# Patient Record
Sex: Female | Born: 1955 | Race: White | Hispanic: No | Smoking: Former smoker
Health system: Southern US, Community
[De-identification: ages and names within clinical notes are randomized; demographics above are authoritative.]

## PROBLEM LIST (undated history)

## (undated) DIAGNOSIS — G5602 Carpal tunnel syndrome, left upper limb: Secondary | ICD-10-CM

## (undated) DIAGNOSIS — I10 Essential (primary) hypertension: Secondary | ICD-10-CM

## (undated) DIAGNOSIS — T7840XA Allergy, unspecified, initial encounter: Secondary | ICD-10-CM

## (undated) DIAGNOSIS — F1010 Alcohol abuse, uncomplicated: Secondary | ICD-10-CM

## (undated) DIAGNOSIS — I517 Cardiomegaly: Secondary | ICD-10-CM

## (undated) HISTORY — DX: Carpal tunnel syndrome, left upper limb: G56.02

## (undated) HISTORY — PX: CARPAL TUNNEL RELEASE: SHX101

## (undated) HISTORY — DX: Alcohol abuse, uncomplicated: F10.10

## (undated) HISTORY — PX: EYE SURGERY: SHX253

## (undated) HISTORY — DX: Cardiomegaly: I51.7

## (undated) HISTORY — DX: Allergy, unspecified, initial encounter: T78.40XA

---

## 1999-01-13 ENCOUNTER — Other Ambulatory Visit: Admission: RE | Admit: 1999-01-13 | Discharge: 1999-01-13 | Payer: Self-pay | Admitting: Obstetrics & Gynecology

## 1999-07-16 ENCOUNTER — Ambulatory Visit (HOSPITAL_BASED_OUTPATIENT_CLINIC_OR_DEPARTMENT_OTHER): Admission: RE | Admit: 1999-07-16 | Discharge: 1999-07-16 | Payer: Self-pay | Admitting: Orthopedic Surgery

## 2000-03-02 ENCOUNTER — Other Ambulatory Visit: Admission: RE | Admit: 2000-03-02 | Discharge: 2000-03-02 | Payer: Self-pay | Admitting: Obstetrics & Gynecology

## 2001-03-10 ENCOUNTER — Other Ambulatory Visit: Admission: RE | Admit: 2001-03-10 | Discharge: 2001-03-10 | Payer: Self-pay | Admitting: Obstetrics & Gynecology

## 2002-03-22 ENCOUNTER — Other Ambulatory Visit: Admission: RE | Admit: 2002-03-22 | Discharge: 2002-03-22 | Payer: Self-pay | Admitting: Obstetrics and Gynecology

## 2002-05-18 ENCOUNTER — Ambulatory Visit (HOSPITAL_COMMUNITY): Admission: RE | Admit: 2002-05-18 | Discharge: 2002-05-18 | Payer: Self-pay | Admitting: Internal Medicine

## 2002-05-18 ENCOUNTER — Encounter: Payer: Self-pay | Admitting: Internal Medicine

## 2003-04-04 ENCOUNTER — Other Ambulatory Visit: Admission: RE | Admit: 2003-04-04 | Discharge: 2003-04-04 | Payer: Self-pay | Admitting: Obstetrics & Gynecology

## 2004-05-30 ENCOUNTER — Other Ambulatory Visit: Admission: RE | Admit: 2004-05-30 | Discharge: 2004-05-30 | Payer: Self-pay | Admitting: Obstetrics & Gynecology

## 2005-06-01 ENCOUNTER — Other Ambulatory Visit: Admission: RE | Admit: 2005-06-01 | Discharge: 2005-06-01 | Payer: Self-pay | Admitting: Obstetrics and Gynecology

## 2012-07-06 ENCOUNTER — Ambulatory Visit (INDEPENDENT_AMBULATORY_CARE_PROVIDER_SITE_OTHER): Payer: 59 | Admitting: Physician Assistant

## 2012-07-06 VITALS — BP 139/69 | HR 80 | Temp 98.1°F | Resp 16 | Ht 61.0 in | Wt 132.0 lb

## 2012-07-06 DIAGNOSIS — H6120 Impacted cerumen, unspecified ear: Secondary | ICD-10-CM

## 2012-07-06 DIAGNOSIS — J069 Acute upper respiratory infection, unspecified: Secondary | ICD-10-CM

## 2012-07-06 MED ORDER — IPRATROPIUM BROMIDE 0.03 % NA SOLN: 2.0000 | Freq: Two times a day (BID) | NASAL | Status: DC

## 2012-07-06 MED ORDER — BENZONATATE 100 MG PO CAPS: 100.0000 mg | ORAL_CAPSULE | Freq: Three times a day (TID) | ORAL | Status: DC | PRN

## 2012-07-06 MED ORDER — GUAIFENESIN ER 1200 MG PO TB12: 1.0000 | ORAL_TABLET | Freq: Two times a day (BID) | ORAL | Status: DC | PRN

## 2012-07-06 NOTE — Patient Instructions (Signed)
Get plenty of rest and drink at least 64 ounces of water daily. If your symptoms are not beginning to improve in the next 3-4 days, please contact me with your specific symptoms, as you may need an antibiotic.

## 2012-07-06 NOTE — Progress Notes (Signed)
  Subjective:    Patient ID: Rebekah Fischer, female    DOB: Jun 19, 1955, 57 y.o.   MRN: [id number]  HPI This 57 y.o. female presents for evaluation of possible bronchitis.  2-3 days ago developed a head cold, and it has now moved into the chest.  "I don't want to wait like I usually do, until it turns into bronchitis."  Laryngitis upon waking up this morning, resolved.  Mild sore throat.  Pain in chest and throat with coughing.  Sputum is yellowish white.  "My lips feel hot and that usually means I have a fever."  No GI/GU/unexplained myalgias or arthralgias.   Past Medical History  Diagnosis Date  . Allergy     Past Surgical History  Procedure Laterality Date  . Carpal tunnel release      Prior to Admission medications   Not on File    Allergies  Allergen Reactions  . Penicillins Rash    History   Social History  . Marital Status: Divorced    Spouse Name: N/A    Number of Children: 0  . Years of Education: 16   Occupational History  . Licensed conveyancer    Social History Main Topics  . Smoking status: Former Smoker -- 0.50 packs/day    Types: Cigarettes    Start date: 08/11/1969    Quit date: 08/11/1984  . Smokeless tobacco: Never Used  . Alcohol Use: 1.5 oz/week    3 drink(s) per week  . Drug Use: No     Comment: previously used marijuana  . Sexually Active: No   Other Topics Concern  . Not on file   Social History Narrative   Lives alone.    Family History  Problem Relation Age of Onset  . Heart failure Mother   . Hypertension Mother   . Emphysema Father   . Hypertension Brother   . Cancer Sister     "blood cancer"    Review of Systems As above.    Objective:   Physical Exam  Blood pressure 139/69, pulse 80, temperature 98.1 F (36.7 C), resp. rate 16, height 5\' 1"  (1.549 m), weight 132 lb (59.875 kg). Body mass index is 24.95 kg/(m^2). Well-developed, well nourished WF who is awake, alert and oriented, in NAD. HEENT: [place]/AT, PERRL, EOMI.  Sclera  and conjunctiva are clear.  EAC are patent, after removal of cerumen from the LEFT canal, TMs are normal in appearance. Nasal mucosa is mildly congested, pink and moist. OP is clear. Neck: supple, non-tender, no lymphadenopathy, thyromegaly. Heart: RRR, no murmur Lungs: normal effort, CTA Extremities: no cyanosis, clubbing or edema. Skin: warm and dry without rash. Psychologic: good mood and appropriate affect, normal speech and behavior.       Assessment & Plan:  Viral URI with cough - Plan: ipratropium (ATROVENT) 0.03 % nasal spray, Guaifenesin (MUCINEX MAXIMUM STRENGTH) 1200 MG TB12, benzonatate (TESSALON) 100 MG capsule  Excess wax in ear, left -removed  Patient Instructions  Get plenty of rest and drink at least 64 ounces of water daily. If your symptoms are not beginning to improve in the next 3-4 days, please contact me with your specific symptoms, as you may need an antibiotic.

## 2012-08-26 ENCOUNTER — Encounter: Payer: Self-pay | Admitting: Internal Medicine

## 2013-03-09 ENCOUNTER — Other Ambulatory Visit: Payer: Self-pay

## 2013-08-16 ENCOUNTER — Encounter: Payer: Self-pay | Admitting: Internal Medicine

## 2013-12-26 ENCOUNTER — Telehealth: Payer: Self-pay

## 2013-12-26 ENCOUNTER — Ambulatory Visit (INDEPENDENT_AMBULATORY_CARE_PROVIDER_SITE_OTHER): Payer: 59 | Admitting: Internal Medicine

## 2013-12-26 VITALS — BP 138/80 | HR 76 | Temp 97.8°F | Resp 16 | Ht 61.0 in | Wt 123.0 lb

## 2013-12-26 DIAGNOSIS — R21 Rash and other nonspecific skin eruption: Secondary | ICD-10-CM

## 2013-12-26 DIAGNOSIS — L282 Other prurigo: Secondary | ICD-10-CM

## 2013-12-26 DIAGNOSIS — L259 Unspecified contact dermatitis, unspecified cause: Secondary | ICD-10-CM

## 2013-12-26 MED ORDER — MUPIROCIN CALCIUM 2 % EX CREA: 1.0000 "application " | TOPICAL_CREAM | Freq: Two times a day (BID) | CUTANEOUS | Status: DC

## 2013-12-26 MED ORDER — TRIAMCINOLONE ACETONIDE 0.1 % EX OINT: 1.0000 "application " | TOPICAL_OINTMENT | Freq: Two times a day (BID) | CUTANEOUS | Status: DC

## 2013-12-26 NOTE — Progress Notes (Signed)
   Subjective:    Patient ID: Rebekah Fischer, female    DOB: October 24, 1955, 58 y.o.   MRN: [id number]  HPI 58 year old female presents for evaluation of pruritic rash on her face and neck.  States symptoms initially started 1 month ago while but spontaneously resolved. She initially tried neosporin that seemed to help some but ultimately the rash seemed to go away on its own. Then, about 1 week ago, it returned. She has been using OTC oils that have not helped, then last night tried a vitamin C mask that burned and seemed to make it much worse. Describes as pruritic and spreading.  Denies fever, chills, nausea, vomiting, or fatigue. No new facial products, soaps, or detergents. Does admit she had a manicure about 1 month ago, but does not typically wear nail polish.     Review of Systems  Constitutional: Negative for fever, chills and fatigue.  Skin: Positive for color change and rash.  Neurological: Negative for dizziness and headaches.       Objective:   Physical Exam  Constitutional: She is oriented to person, place, and time. She appears well-developed and well-nourished.  HENT:  Head: Normocephalic and atraumatic.  Right Ear: External ear normal.  Left Ear: External ear normal.  Eyes: Conjunctivae are normal.    Noted areas have sharply demarcated, erythematous rash with honey colored crusting.   Neck: Normal range of motion.    Noted area is sharply demarcated, erythematous, crusting lesion. No drainage, warmth, or tenderness.   Cardiovascular: Normal rate.   Pulmonary/Chest: Effort normal.  Neurological: She is alert and oriented to person, place, and time.  Psychiatric: She has a normal mood and affect. Her behavior is normal. Judgment and thought content normal.          Assessment & Plan:  Contact dermatitis  Rash and nonspecific skin eruption - Plan: mupirocin cream (BACTROBAN) 2 %, triamcinolone ointment (KENALOG) 0.1 %  Pruritic rash  Likely contact dermatitis to  nail products from manicure 1 month ago, and then continued due to application of multiple different OTC topical products. Plan to use vaseline to affected areas twice daily x 2 days, then start appication of TAC 0.1% ointment daily x 1 week. Recheck in 3-4 days if not improving, sooner if worse.

## 2013-12-26 NOTE — Telephone Encounter (Signed)
Patient was seen this morning.   She had a missed call on  V her phone.  Please call again

## 2013-12-26 NOTE — Telephone Encounter (Signed)
No message in epic as to reason for call. Please advise if pt needs a rtn call.

## 2013-12-26 NOTE — Telephone Encounter (Signed)
I spoke with patient after her visit and discussed tx plan. No further call needed

## 2019-09-12 ENCOUNTER — Encounter (HOSPITAL_COMMUNITY): Payer: Self-pay

## 2019-09-12 ENCOUNTER — Ambulatory Visit (HOSPITAL_COMMUNITY)
Admission: RE | Admit: 2019-09-12 | Discharge: 2019-09-12 | Disposition: A | Discharge status: Home / Self Care | Payer: Federal, State, Local not specified - Other | Attending: Psychiatry | Admitting: Psychiatry

## 2019-09-12 ENCOUNTER — Emergency Department (HOSPITAL_COMMUNITY): Payer: Self-pay

## 2019-09-12 ENCOUNTER — Other Ambulatory Visit: Payer: Self-pay

## 2019-09-12 ENCOUNTER — Emergency Department (HOSPITAL_COMMUNITY)
Admission: EM | Admit: 2019-09-12 | Discharge: 2019-09-13 | Disposition: A | Discharge status: Home / Self Care | Payer: Self-pay | Attending: Emergency Medicine | Admitting: Emergency Medicine

## 2019-09-12 DIAGNOSIS — R440 Auditory hallucinations: Secondary | ICD-10-CM | POA: Insufficient documentation

## 2019-09-12 DIAGNOSIS — Z87891 Personal history of nicotine dependence: Secondary | ICD-10-CM | POA: Insufficient documentation

## 2019-09-12 DIAGNOSIS — F39 Unspecified mood [affective] disorder: Secondary | ICD-10-CM

## 2019-09-12 DIAGNOSIS — F333 Major depressive disorder, recurrent, severe with psychotic symptoms: Secondary | ICD-10-CM | POA: Diagnosis present

## 2019-09-12 DIAGNOSIS — Z20822 Contact with and (suspected) exposure to covid-19: Secondary | ICD-10-CM | POA: Insufficient documentation

## 2019-09-12 DIAGNOSIS — F600 Paranoid personality disorder: Secondary | ICD-10-CM | POA: Insufficient documentation

## 2019-09-12 DIAGNOSIS — R441 Visual hallucinations: Secondary | ICD-10-CM | POA: Insufficient documentation

## 2019-09-12 DIAGNOSIS — F4323 Adjustment disorder with mixed anxiety and depressed mood: Secondary | ICD-10-CM | POA: Insufficient documentation

## 2019-09-12 DIAGNOSIS — F419 Anxiety disorder, unspecified: Secondary | ICD-10-CM | POA: Insufficient documentation

## 2019-09-12 LAB — URINALYSIS, ROUTINE W REFLEX MICROSCOPIC
Bacteria, UA: NONE SEEN
Bilirubin Urine: NEGATIVE
Glucose, UA: NEGATIVE mg/dL
Ketones, ur: 20 mg/dL — AB
Leukocytes,Ua: NEGATIVE
Nitrite: NEGATIVE
Protein, ur: 30 mg/dL — AB
Specific Gravity, Urine: 1.01 (ref 1.005–1.030)
pH: 7 (ref 5.0–8.0)

## 2019-09-12 LAB — COMPREHENSIVE METABOLIC PANEL
ALT: 15 U/L (ref 0–44)
AST: 21 U/L (ref 15–41)
Albumin: 5 g/dL (ref 3.5–5.0)
Alkaline Phosphatase: 86 U/L (ref 38–126)
Anion gap: 14 (ref 5–15)
BUN: 9 mg/dL (ref 8–23)
CO2: 24 mmol/L (ref 22–32)
Calcium: 9.3 mg/dL (ref 8.9–10.3)
Chloride: 94 mmol/L — ABNORMAL LOW (ref 98–111)
Creatinine, Ser: 0.63 mg/dL (ref 0.44–1.00)
GFR calc Af Amer: >60 mL/min (ref >60)
GFR calc non Af Amer: >60 mL/min (ref >60)
Glucose, Bld: 141 mg/dL — ABNORMAL HIGH (ref 70–99)
Potassium: 4.1 mmol/L (ref 3.5–5.1)
Sodium: 132 mmol/L — ABNORMAL LOW (ref 135–145)
Total Bilirubin: 1 mg/dL (ref 0.3–1.2)
Total Protein: 8.3 g/dL — ABNORMAL HIGH (ref 6.5–8.1)

## 2019-09-12 LAB — CBC WITH DIFFERENTIAL/PLATELET
Abs Immature Granulocytes: 0.02 10*3/uL (ref 0.00–0.07)
Basophils Absolute: 0 10*3/uL (ref 0.0–0.1)
Basophils Relative: 0 %
Eosinophils Absolute: 0 10*3/uL (ref 0.0–0.5)
Eosinophils Relative: 0 %
HCT: 38.8 % (ref 36.0–46.0)
Hemoglobin: 13.1 g/dL (ref 12.0–15.0)
Immature Granulocytes: 0 %
Lymphocytes Relative: 15 %
Lymphs Abs: 1.1 10*3/uL (ref 0.7–4.0)
MCH: 31.5 pg (ref 26.0–34.0)
MCHC: 33.8 g/dL (ref 30.0–36.0)
MCV: 93.3 fL (ref 80.0–100.0)
Monocytes Absolute: 0.6 10*3/uL (ref 0.1–1.0)
Monocytes Relative: 8 %
Neutro Abs: 5.8 10*3/uL (ref 1.7–7.7)
Neutrophils Relative %: 77 %
Platelets: 274 10*3/uL (ref 150–400)
RBC: 4.16 MIL/uL (ref 3.87–5.11)
RDW: 11.8 % (ref 11.5–15.5)
WBC: 7.6 10*3/uL (ref 4.0–10.5)
nRBC: 0 % (ref 0.0–0.2)

## 2019-09-12 LAB — RAPID URINE DRUG SCREEN, HOSP PERFORMED
Amphetamines: NOT DETECTED
Barbiturates: NOT DETECTED
Benzodiazepines: NOT DETECTED
Cocaine: NOT DETECTED
Opiates: NOT DETECTED
Tetrahydrocannabinol: NOT DETECTED

## 2019-09-12 LAB — SARS CORONAVIRUS 2 BY RT PCR (HOSPITAL ORDER, PERFORMED IN ~~LOC~~ HOSPITAL LAB): SARS Coronavirus 2: NEGATIVE

## 2019-09-12 LAB — TSH: TSH: 1.949 u[IU]/mL (ref 0.350–4.500)

## 2019-09-12 LAB — T4, FREE: Free T4: 1.29 ng/dL — ABNORMAL HIGH (ref 0.61–1.12)

## 2019-09-12 LAB — ETHANOL: Alcohol, Ethyl (B): <10 mg/dL (ref <10)

## 2019-09-12 MED ORDER — LABETALOL HCL 5 MG/ML IV SOLN
20.0000 mg | Freq: Once | INTRAVENOUS | Status: AC
  Administered 2019-09-12: 20 mg via INTRAVENOUS
  Filled 2019-09-12: qty 4

## 2019-09-12 MED ORDER — HYDROCHLOROTHIAZIDE 12.5 MG PO CAPS
25.0000 mg | ORAL_CAPSULE | Freq: Once | ORAL | Status: DC
  Filled 2019-09-12: qty 2

## 2019-09-12 MED ORDER — AMMONIA AROMATIC IN INHA
RESPIRATORY_TRACT | Status: AC
  Filled 2019-09-12: qty 10

## 2019-09-12 MED ORDER — SODIUM CHLORIDE 0.9 % IV SOLN: INTRAVENOUS | Status: DC
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
## 2019-09-12 NOTE — ED Triage Notes (Signed)
Pt has no complaints. [facility] sent here for HTN and medical clearance. Visual and auditory hallucinations.

## 2019-09-12 NOTE — ED Notes (Signed)
Attempted to arouse pt and administer HCTZ PO. Pt not answering and refusing to open eyes. Pupils equal and reactive to light. MD present to assess. CT ordered.

## 2019-09-12 NOTE — Progress Notes (Signed)
Pt voluntarily brought by [employer] after a call from pt sister who was concerned about pt. Pt agreed to go with officers. Upon arrival pt was paranoid, worried about someone trying to kill her. Actively having hallucinations about this man coming from [place] to kill her. Pt would not sit down for vitals or stop pacing. Denied SI or HI. Stated this was the first time this had happened. Pt appeared frightened by situation and kept referring to a "he" that was not pleased. Pt was sent to ED for medical clearance d/t high BP.

## 2019-09-12 NOTE — ED Provider Notes (Signed)
Emergency Department Provider Note  I have reviewed the triage vital signs and the nursing notes.  HISTORY  Chief Complaint Medical Clearance   HPI Rebekah Fischer is a 64 y.o. female who presents to the emerge department today for medical clearance.  Patient states that her feet have a abnormal color but do not hurt.  She states that she is seeing and hearing things.  She will expand further upon that.  She does not really participate in history giving very well as she seems to be responding to external stimuli.   No other associated or modifying symptoms.   Level  V Caveat secondary to psychiatric condition.  Past Medical History:  Diagnosis Date  . Allergy     There are no problems to display for this patient.   Past Surgical History:  Procedure Laterality Date  . CARPAL TUNNEL RELEASE      Current Outpatient Rx  . Order #: [id number] Class: Normal  . Order #: [id number] Class: Normal    Allergies Neomycin-polymyxin b gu and Penicillins  Family History  Problem Relation Age of Onset  . Heart failure Mother   . Hypertension Mother   . Hyperlipidemia Mother   . Heart disease Mother   . Emphysema Father   . Hypertension Brother   . Cancer Sister        "blood cancer"    Social History Social History   Tobacco Use  . Smoking status: Former Smoker    Packs/day: 0.50    Types: Cigarettes    Start date: 08/11/1969    Quit date: 08/11/1984    Years since quitting: 35.1  . Smokeless tobacco: Never Used  Substance Use Topics  . Alcohol use: Yes    Alcohol/week: 3.0 standard drinks    Types: 3 Standard drinks or equivalent per week  . Drug use: No    Comment: previously used marijuana    Review of Systems  All other systems negative except as documented in the HPI. All pertinent positives and negatives as reviewed in the HPI. ____________________________________________  PHYSICAL EXAM:  VITAL SIGNS: ED Triage Vitals  Enc Vitals Group     BP 09/12/19 0325  (!) 195/97     Pulse Rate 09/12/19 0325 93     Resp 09/12/19 0325 18     Temp 09/12/19 0325 97.9 F (36.6 C)     Temp Source 09/12/19 0325 Oral     SpO2 09/12/19 0322 98 %     Weight 09/12/19 0325 121 lb 4.1 oz (55 kg)     Height 09/12/19 0325 5\' 1"  (1.549 m)    Constitutional: Alert and oriented. Well appearing and in no acute distress. Eyes: Conjunctivae are normal. PERRL. EOMI. Head: Atraumatic. Nose: No congestion/rhinnorhea. Mouth/Throat: Mucous membranes are moist.  Oropharynx non-erythematous. Neck: No stridor.  No meningeal signs.   Cardiovascular: Normal rate, regular rhythm. Good peripheral circulation. Grossly normal heart sounds.   Respiratory: Normal respiratory effort.  No retractions. Lungs CTAB. Gastrointestinal: Soft and nontender. No distention.  Musculoskeletal: No lower extremity tenderness nor edema. No gross deformities of extremities. Neurologic:  Normal speech and language. No gross focal neurologic deficits are appreciated.  Skin:  Skin is warm, dry and intact. No rash noted.  ____________________________________________   LABS (all labs ordered are listed, but only abnormal results are displayed)  Labs Reviewed  COMPREHENSIVE METABOLIC PANEL - Abnormal; Notable for the following components:      Result Value   Sodium 132 (*)  Chloride 94 (*)    Glucose, Bld 141 (*)    Total Protein 8.3 (*)    All other components within normal limits  URINALYSIS, ROUTINE W REFLEX MICROSCOPIC - Abnormal; Notable for the following components:   Color, Urine STRAW (*)    Hgb urine dipstick SMALL (*)    Ketones, ur 20 (*)    Protein, ur 30 (*)    All other components within normal limits  T4, FREE - Abnormal; Notable for the following components:   Free T4 1.29 (*)    All other components within normal limits  SARS CORONAVIRUS 2 BY RT PCR (HOSPITAL ORDER, [facility])  ETHANOL  RAPID URINE DRUG SCREEN, HOSP PERFORMED  CBC WITH  DIFFERENTIAL/PLATELET  TSH   ____________________________________________  EKG   EKG Interpretation  Date/Time:  Tuesday Sep 12 2019 04:42:01 EDT Ventricular Rate:  98 PR Interval:    QRS Duration: 136 QT Interval:  366 QTC Calculation: 468 R Axis:   76 Text Interpretation: Sinus rhythm Right atrial enlargement Nonspecific intraventricular conduction delay 12 Lead; Mason-Likar No old tracing to compare Confirmed by Merrily Pew [phone]) on 09/12/2019 7:13:34 AM       ____________________________________________  RADIOLOGY  CT Head Wo Contrast  Result Date: 09/12/2019 CLINICAL DATA:  The acute headache with normal neuro exam EXAM: CT HEAD WITHOUT CONTRAST TECHNIQUE: Contiguous axial images were obtained from the base of the skull through the vertex without intravenous contrast. COMPARISON:  None. FINDINGS: Brain: No evidence of acute infarction, hemorrhage, hydrocephalus, extra-axial collection or mass lesion/mass effect. Mild patchy low-density in the cerebral white matter, usually chronic small vessel ischemia. Normal brain volume. Vascular: No hyperdense vessel or unexpected calcification. Skull: Normal. Negative for fracture or focal lesion. Sinuses/Orbits: No acute finding. IMPRESSION: 1. No acute finding. 2. Mild white matter disease Electronically Signed   By: Monte Fantasia M.D.   On: 09/12/2019 07:50   ____________________________________________  PROCEDURES  Procedure(s) performed:   Procedures ____________________________________________  INITIAL IMPRESSION / ASSESSMENT AND PLAN / ED COURSE   This patient presents to the ED for concern of hallucinations, this involves an extensive number of treatment options, and is a complaint that carries with it a high risk of complications and morbidity.  The differential diagnosis includes psychiatric, medical     Lab Tests:   I Ordered, reviewed, and interpreted labs, which included cbc, cmp, UA, UDS which were  unremarkable   Medicines ordered:   I ordered medication HCTZ  For HTN, refuse to take it   Imaging Studies ordered:   I independently visualized and interpreted imaging CT head which was pending at time of care transfer.   Additional history obtained:   Additional history obtained from noone  Previous records obtained and reviewed in epic  Reevaluation:  After the interventions stated above, I reevaluated the patient and found that the patient was refusing to open her eyes or answer questions.  Patient apparently did not respond to ammonia however she would flinch and blink when you touch her eye lashes.  Also she would resist you trying to force her eyelids open and then look away when bright light was shining her eyes.  She would intermittently grunt answer questions.  I consider this to possibly be catatonic but she was awake and alert just an hour or 2 prior to this is more volitional.  However other significant elevated blood pressure CT head was ordered to ensure there was no bleed or mass-effect. Care transferred  pending same. If normal, pending TTS consult.      ____________________________________________  FINAL CLINICAL IMPRESSION(S) / ED DIAGNOSES  Final diagnoses:  None    MEDICATIONS GIVEN DURING THIS VISIT:  Medications - No data to display  NEW OUTPATIENT MEDICATIONS STARTED DURING THIS VISIT:  New Prescriptions   No medications on file    Note:  This note was prepared with assistance of Dragon voice recognition software. Occasional wrong-word or sound-a-like substitutions may have occurred due to the inherent limitations of voice recognition software.   Marily Memos, MD 09/12/19 2326

## 2019-09-12 NOTE — ED Notes (Signed)
Attempted to go into pt's room to speak with pt. Pt refused to open eyes or acknowledge this RN. Opened up pt's eyes to assess, pt fighting opening her eyes and refusing to look at this nurse, rolling eyes up. Pt continuously trying to turn head to avoid looking at this rn.

## 2019-09-12 NOTE — Care Management (Signed)
Writer referred patient to the following facilities:  [facility], [place], [address], [place], [place], [facility], [facility], [place], [place].
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
## 2019-09-13 DIAGNOSIS — F4323 Adjustment disorder with mixed anxiety and depressed mood: Secondary | ICD-10-CM | POA: Diagnosis present

## 2019-09-13 MED ORDER — NAPROXEN 500 MG PO TABS
500.0000 mg | ORAL_TABLET | Freq: Once | ORAL | Status: AC
  Administered 2019-09-13: 500 mg via ORAL
  Filled 2019-09-13: qty 1

## 2019-09-13 NOTE — ED Notes (Signed)
Patient is dressing out in burgundy scrubs. Asked sitter for hallway c to watch the door to 22 and make sure patient did not elope. Sitter said she would do so.

## 2019-09-13 NOTE — BH Assessment (Addendum)
BHH Assessment Progress Note  Per Shuvon Rankin, FNP, this pt does not require psychiatric hospitalization at this time.  Pt is to be discharged from [facility] with recommendation to follow up with [facility].  This has been included in pt's discharge instructions.  Pt's nurse has been notified.  Doylene Canning, MA Triage Specialist [phone]   Addendum:  Denice Bors reports that she spoke to pt's sister to obtain collateral information.  Per [employer] pt's sister will be here in the next hour or two to pick pt up.  Pt's nurse, Molli Hazard, has been notified.  Doylene Canning, [place] Behavioral Health Coordinator [phone]

## 2019-09-13 NOTE — ED Notes (Signed)
Patient is now alert, talking, and able to ambulate to bathroom without assistance. She states she did not talk to staff yesterday "because they gave me something that made me unable to move". She states she feels better and does not feel she needs inpatient admission.  She requested toiletries. Toothbrush, toothpaste, soap, comb, deodorant given.

## 2019-09-13 NOTE — Progress Notes (Signed)
TOC CM spoke to pt and states she goes to Urgent Care at [facility] as her PCP. Pt reports she has never medications for her blood pressure. Isidoro Donning RN CCM, [facility] ED TOC CM [phone]

## 2019-09-13 NOTE — ED Provider Notes (Signed)
Emergency Medicine Observation Re-evaluation Note  Rebekah Fischer is a 64 y.o. female, seen on rounds today.  Pt initially presented to the ED for complaints of Medical Clearance Currently, the patient is awaiting placement .  Physical Exam  BP (!) 144/75 (BP Location: Right Arm)   Pulse 75   Temp 97.8 F (36.6 C) (Oral)   Resp 16   Ht 1.549 m (5\' 1" )   Wt 55 kg   SpO2 97%   BMI 22.91 kg/m  Physical Exam  ED Course / MDM  EKG:EKG Interpretation  Date/Time:  Tuesday Sep 12 2019 04:42:01 EDT Ventricular Rate:  98 PR Interval:    QRS Duration: 136 QT Interval:  366 QTC Calculation: 468 R Axis:   76 Text Interpretation: Sinus rhythm Right atrial enlargement Nonspecific intraventricular conduction delay 12 Lead; Mason-Likar No old tracing to compare Confirmed by [id number] [phone]) on 09/12/2019 7:13:34 AM    I have reviewed the labs performed to date as well as medications administered while in observation.  Recent changes in the last 24 hours include none. Plan  Current plan is for inpatient psychiatric treatment. Patient is not under full IVC at this time.   11/12/2019, MD 09/13/19 [phone]

## 2019-09-13 NOTE — Discharge Instructions (Signed)
For your mental health needs, you are advised to follow up with [facility].  Call them at your earliest opportunity to schedule an intake appointment:       [facility]      [address]      [place], [place] [address]      [phone]      Crisis number: [phone]  

## 2019-09-13 NOTE — Consult Note (Signed)
[facility] ED Discharge  09/13/2019 12:56 PM Rebekah Fischer  MRN:  [id number] Principal Problem: Adjustment disorder with mixed anxiety and depressed mood Discharge Diagnoses: Principal Problem:   Adjustment disorder with mixed anxiety and depressed mood   Subjective: " I was stressed out, I lost my job and was about to loose my home" Patient seen in [place] [facility] ER via [facility] sitting alert and oriented x4.  She is much calmer today and willingly answered questions asked of her. Patient denied previous Psychiatric illness and this is her first visit to the ER for Psychiatric symptoms.  She reported been brought in voluntarily by her sister for evaluation of anxiety with Paranoia and Delusion.  She had paranoid Delusion that some man was about to kills her.  She also had reported hearing voices of a man.  She reported feeling hopeless, worthless and was tearful and irritable on arrival to the  ER yesterday. Today, she is calm and denied feeling depressed or anxious.  She stated that she was overwhelmed and fearful that she was about to loose her home having lost her job recently.  She is about to sell her home and her sister has intervened to help her.  She denies suicide/homicide ideations.  She denied AVH and no mention of paranoia. Collateral information from her sister Lequita Asal is that patient was/is going through some stressful moment.  She reports that there is positive hx of anxiety and depression in the family.  She denies family hx of suicide, alcohol or illicit drug abuse.  She denies any form of abuse for patient. She will be picking up patient on discharge this afternoon.  Total Time spent with patient: 30 minutes  Past Psychiatric History: na  Past Medical History:  Past Medical History:  Diagnosis Date  . Allergy     Past Surgical History:  Procedure Laterality Date  . CARPAL TUNNEL RELEASE     Family History:  Family History  Problem Relation Age of Onset  . Heart failure  Mother   . Hypertension Mother   . Hyperlipidemia Mother   . Heart disease Mother   . Emphysema Father   . Hypertension Brother   . Cancer Sister        "blood cancer"   Family Psychiatric  History:  Sister- Anxiety, another sister PTSD Social History:  Social History   Substance and Sexual Activity  Alcohol Use Yes  . Alcohol/week: 3.0 standard drinks  . Types: 3 Standard drinks or equivalent per week     Social History   Substance and Sexual Activity  Drug Use No   Comment: previously used marijuana    Social History   Socioeconomic History  . Marital status: Divorced    Spouse name: N/A  . Number of children: 0  . Years of education: 54  . Highest education level: Not on file  Occupational History  . Occupation: Public house manager  Tobacco Use  . Smoking status: Former Smoker    Packs/day: 0.50    Types: Cigarettes    Start date: 08/11/1969    Quit date: 08/11/1984    Years since quitting: 35.1  . Smokeless tobacco: Never Used  Substance and Sexual Activity  . Alcohol use: Yes    Alcohol/week: 3.0 standard drinks    Types: 3 Standard drinks or equivalent per week  . Drug use: No    Comment: previously used marijuana  . Sexual activity: Never  Other Topics Concern  . Not on file  Social History Narrative   Lives alone.   Social Determinants of Health   Financial Resource Strain:   . Difficulty of Paying Living Expenses:   Food Insecurity:   . Worried About Programme researcher, broadcasting/film/video in the Last Year:   . Barista in the Last Year:   Transportation Needs:   . Freight forwarder (Medical):   Marland Kitchen Lack of Transportation (Non-Medical):   Physical Activity:   . Days of Exercise per Week:   . Minutes of Exercise per Session:   Stress:   . Feeling of Stress :   Social Connections:   . Frequency of Communication with Friends and Family:   . Frequency of Social Gatherings with Friends and Family:   . Attends Religious Services:   . Active Member of Clubs  or Organizations:   . Attends Banker Meetings:   Marland Kitchen Marital Status:     Has this patient used any form of tobacco in the last 30 days? (Cigarettes, Smokeless Tobacco, Cigars, and/or Pipes) Prescription not provided because: Patient stopped smoking Cigarette 35 years ago.  Current Medications: Current Facility-Administered Medications  Medication Dose Route Frequency Provider Last Rate Last Admin  . 0.9 %  sodium chloride infusion   Intravenous Continuous Cathren Laine, MD   Stopped at 09/12/19 1900  . hydrochlorothiazide (MICROZIDE) capsule 25 mg  25 mg Oral Once Mesner, Barbara Cower, MD   Stopped at 09/12/19 0600   No current outpatient medications on file.   PTA Medications: (Not in a hospital admission)   Musculoskeletal: Strength & Muscle Tone: within normal limits Gait & Station: Seen sitting in bed via Telepsych Patient leans: N/A  Psychiatric Specialty Exam: Physical Exam  Review of Systems  Constitutional: Negative.   HENT: Negative.   Eyes: Negative.   Respiratory: Negative.   Cardiovascular: Negative.   Gastrointestinal: Negative.   Genitourinary: Negative.   Musculoskeletal: Negative.   Skin: Negative.   Allergic/Immunologic: Negative.   Neurological: Negative.   Psychiatric/Behavioral: Positive for sleep disturbance. The patient is nervous/anxious.     Blood pressure (!) 144/75, pulse 75, temperature 97.8 F (36.6 C), temperature source Oral, resp. rate 16, height 5\' 1"  (1.549 m), weight 55 kg, SpO2 97 %.Body mass index is 22.91 kg/m.  General Appearance: Casual  Eye Contact:  Good  Speech:  Clear and Coherent and Normal Rate  Volume:  Normal  Mood:  Anxious and Depressed  Affect:  Congruent  Thought Process:  Coherent  Orientation:  Full (Time, Place, and Person)  Thought Content:  Negative  Suicidal Thoughts:  No  Homicidal Thoughts:  No  Memory:  intact  Judgement:  Good  Insight:  Good  Psychomotor Activity:  Normal  Concentration:   Concentration: Good and Attention Span: Good  Recall:  Good  Fund of Knowledge:  Good  Language:  Good  Akathisia:  NA  Handed:  Right  AIMS (if indicated):     Assets:  Communication Skills Desire for Improvement Housing Transportation  ADL's:  Intact  Cognition:  WNL  Sleep:        Demographic Factors:  Caucasian, Unemployed and female strong support from her sister.  Loss Factors: Financial problems/change in socioeconomic status  Historical Factors: Family history of mental illness or substance abuse  Risk Reduction Factors:   Living with another person, especially a relative and Positive therapeutic relationship  Continued Clinical Symptoms:  Adjustment disorder with mixed anxiety and depressed mood. Cognitive Features That Contribute To Risk:  None  Suicide Risk:  Minimal: No identifiable suicidal ideation.  Patients presenting with no risk factors but with morbid ruminations; may be classified as minimal risk based on the severity of the depressive symptoms    Plan Of Care/Follow-up recommendations:  Activity:  As tolerated Diet:  Regular Tests:  na Other:  na   Disposition:  Discharge home to sister with referral to outpatient Psychiatric care Earney Navy, NP 09/13/2019, 12:56 PM

## 2019-09-13 NOTE — ED Notes (Signed)
Patient's sister is to come pick her up within the hour

## 2019-09-13 NOTE — ED Provider Notes (Signed)
2:00 AM  I was asked by nursing staff to reevaluate the patient.  She is here voluntarily for psychiatric evaluation.  She tells me that last night while taking a shower she heard a voice tell her that "you are going to die a violent death".  She was evaluated by TTS who also spoke to her sister and was concerned about hallucinations, disorganized thoughts.  They recommended inpatient psychiatric treatment and are looking for placement.  Discussed my concerns with patient.  At this time she agrees to stay voluntarily but would like to talk to TTS again.  Nursing staff to relay information to TTS.   Reymond Maynez, Layla Maw, DO 09/13/19 0203

## 2019-09-13 NOTE — ED Notes (Signed)
Pt ambulated to BR. Says she wants to go home. Messaged MD

## 2019-09-15 ENCOUNTER — Other Ambulatory Visit: Payer: Self-pay

## 2019-09-15 ENCOUNTER — Encounter: Payer: Self-pay | Admitting: Family Medicine

## 2019-09-15 ENCOUNTER — Ambulatory Visit (INDEPENDENT_AMBULATORY_CARE_PROVIDER_SITE_OTHER): Payer: 59 | Admitting: Family Medicine

## 2019-09-15 VITALS — BP 148/78 | HR 83 | Temp 97.9°F | Ht 61.0 in | Wt 119.8 lb

## 2019-09-15 DIAGNOSIS — I517 Cardiomegaly: Secondary | ICD-10-CM | POA: Diagnosis not present

## 2019-09-15 DIAGNOSIS — I10 Essential (primary) hypertension: Secondary | ICD-10-CM | POA: Diagnosis not present

## 2019-09-15 DIAGNOSIS — Z23 Encounter for immunization: Secondary | ICD-10-CM | POA: Diagnosis not present

## 2019-09-15 MED ORDER — HYDROCHLOROTHIAZIDE 12.5 MG PO TABS: 12.5000 mg | ORAL_TABLET | Freq: Every day | ORAL | 3 refills | Status: DC

## 2019-09-15 NOTE — Addendum Note (Signed)
Addended by: Lisbeth Renshaw, MEI HUA on: 09/15/2019 10:40 AM   Modules accepted: Orders

## 2019-09-15 NOTE — Patient Instructions (Addendum)
Continue to do your regular walking at least 5 days a week.  Take hydrochlorothiazide 12.5 mg 1 each morning  Get your blood pressure checked every week or 2 and record it.  Return in about 3 months to follow-up      If you have lab work done today you will be contacted with your lab results within the next 2 weeks.  If you have not heard from [place] then please contact us. The fastest way to get your results is to register for My Chart.   IF you received an x-[place] today, you will receive an invoice from [facility]. Please contact [facility] at [phone] with questions or concerns regarding your invoice.   IF you received labwork today, you will receive an invoice from [facility]. Please contact [facility] at [phone] with questions or concerns regarding your invoice.   Our billing staff will not be able to assist you with questions regarding bills from these companies.  You will be contacted with the lab results as soon as they are available. The fastest way to get your results is to activate your My Chart account. Instructions are located on the last page of this paperwork. If you have not heard from [place] regarding the results in 2 weeks, please contact this office.

## 2019-09-15 NOTE — Progress Notes (Signed)
Patient ID: Rebekah Fischer, female    DOB: 07-10-1955  Age: 64 y.o. MRN: [id number]  Chief Complaint  Patient presents with  . Hypertension    f/u     Subjective:   Patient is here because of concern regarding her blood pressure.  She has a history of running elevated blood pressure over some time, but has not been on treatment for it.  She was in the emergency room a few days ago and blood pressures were running high then.  When she entered a trial recently for a COVID-19 study she had an EKG done which showed a mild abnormality.  In the emergency room they repeated a couple of EKGs on her and she had some mild atrial enlargement.  She is otherwise asymptomatic.  She gets a lot of regular exercise with good walking.  She does not smoke.  She generally feels well.  She is not working a job at this time.  Current allergies, medications, problem list, past/family and social histories reviewed.  Objective:  BP (!) 148/78   Pulse 83   Temp 97.9 F (36.6 C) (Temporal)   Ht 5\' 1"  (1.549 m)   Wt 119 lb 12.8 oz (54.3 kg)   SpO2 99%   BMI 22.64 kg/m   Healthy-appearing lady.  Fundi normal.  No thyromegaly.  Chest is clear to auscultation.  Heart regular.  Has a possible very faint right sternal systolic ejection murmur, insignificant.  She was told she had a murmur.   Assessment & Plan:   Assessment: 1. Essential hypertension   2. Mild right atrial enlargement       Plan: We will treat for the blood pressure to try and bring it down a little bit into the normal range.  Urged her to monitor it periodically.  Return in 3 months to follow-up.  Otherwise no special concerns.  I am not concerned about her heart at this time, probably just a mild effect of long-term blood pressure elevations.  No orders of the defined types were placed in this encounter.   No orders of the defined types were placed in this encounter.        Patient Instructions   Continue to do your regular walking at  least 5 days a week.  Take hydrochlorothiazide 12.5 mg 1 each morning  Get your blood pressure checked every week or 2 and record it.  Return in about 3 months to follow-up      If you have lab work done today you will be contacted with your lab results within the next 2 weeks.  If you have not heard from then please contact us. The fastest way to get your results is to register for My Chart.   IF you received an x-[place] today, you will receive an invoice from [facility]. Please contact [facility] at [phone] with questions or concerns regarding your invoice.   IF you received labwork today, you will receive an invoice from [facility]. Please contact [facility] at [phone] with questions or concerns regarding your invoice.   Our billing staff will not be able to assist you with questions regarding bills from these companies.  You will be contacted with the lab results as soon as they are available. The fastest way to get your results is to activate your My Chart account. Instructions are located on the last page of this paperwork. If you have not heard from [phone] regarding the results in 2 weeks, please contact this office.  No follow-ups on file.   Ruben Reason, MD 09/15/2019

## 2019-10-03 ENCOUNTER — Ambulatory Visit (INDEPENDENT_AMBULATORY_CARE_PROVIDER_SITE_OTHER): Payer: 59 | Admitting: Registered Nurse

## 2019-10-03 ENCOUNTER — Encounter: Payer: Self-pay | Admitting: Registered Nurse

## 2019-10-03 ENCOUNTER — Other Ambulatory Visit: Payer: Self-pay

## 2019-10-03 VITALS — BP 154/80 | HR 83 | Temp 98.1°F | Resp 17 | Ht 61.0 in | Wt 123.2 lb

## 2019-10-03 DIAGNOSIS — Z13220 Encounter for screening for lipoid disorders: Secondary | ICD-10-CM | POA: Diagnosis not present

## 2019-10-03 DIAGNOSIS — Z1231 Encounter for screening mammogram for malignant neoplasm of breast: Secondary | ICD-10-CM | POA: Diagnosis not present

## 2019-10-03 DIAGNOSIS — Z0000 Encounter for general adult medical examination without abnormal findings: Secondary | ICD-10-CM

## 2019-10-03 DIAGNOSIS — Z7689 Persons encountering health services in other specified circumstances: Secondary | ICD-10-CM

## 2019-10-03 DIAGNOSIS — I10 Essential (primary) hypertension: Secondary | ICD-10-CM

## 2019-10-03 DIAGNOSIS — Z1159 Encounter for screening for other viral diseases: Secondary | ICD-10-CM

## 2019-10-03 DIAGNOSIS — F329 Major depressive disorder, single episode, unspecified: Secondary | ICD-10-CM

## 2019-10-03 DIAGNOSIS — Z0001 Encounter for general adult medical examination with abnormal findings: Secondary | ICD-10-CM | POA: Diagnosis not present

## 2019-10-03 DIAGNOSIS — F419 Anxiety disorder, unspecified: Secondary | ICD-10-CM

## 2019-10-03 MED ORDER — ESCITALOPRAM OXALATE 5 MG PO TABS: 5.0000 mg | ORAL_TABLET | Freq: Every day | ORAL | 0 refills | Status: DC

## 2019-10-03 MED ORDER — ESCITALOPRAM OXALATE 10 MG PO TABS: 10.0000 mg | ORAL_TABLET | Freq: Every day | ORAL | 0 refills | Status: DC

## 2019-10-03 NOTE — Patient Instructions (Signed)
° ° ° °  If you have lab work done today you will be contacted with your lab results within the next 2 weeks.  If you have not heard from us then please contact us. The fastest way to get your results is to register for My Chart. ° ° °IF you received an x-[place] today, you will receive an invoice from [facility]. Please contact [facility] at [phone] with questions or concerns regarding your invoice.  ° °IF you received labwork today, you will receive an invoice from [facility]. Please contact [facility] at [phone] with questions or concerns regarding your invoice.  ° °Our billing staff will not be able to assist you with questions regarding bills from these companies. ° °You will be contacted with the lab results as soon as they are available. The fastest way to get your results is to activate your My Chart account. Instructions are located on the last page of this paperwork. If you have not heard from us regarding the results in 2 weeks, please contact this office. °  ° ° ° °

## 2019-10-03 NOTE — Progress Notes (Signed)
Established Patient Office Visit  Subjective:  Patient ID: Rebekah Fischer, female    DOB: 03/26/1956  Age: 64 y.o. MRN: [id number]  CC:  Chief Complaint  Patient presents with  . New Patient (Initial Visit)    Establish care and CPE    HPI Rebekah Fischer presents for visit to establish care and CPE  Notes that she has had worsening anxiety for the past few months. Has not been treated for anxiety previously. Denies HI/SI. Reports waking at night in a panic with palpitations, frequent tearfulness. No clear triggers for anxiety that she is aware of.  Otherwise, history includes HTN for which she recently started HCTZ 12.5mg  PO qd. Good effect. Has ordered home cuff but has not arrived yet. Will monitor routinely. Acknowledges that her anxiety likely contributes to her bp. Denies chest pain, shob, doe, headache, visual changes, claudication, dependent edema. Of note, previous EKG had shown mild atrial enlargement, likely result of longstanding HTN.  No further concerns. Feeling well.   Past Medical History:  Diagnosis Date  . Allergy   . Right atrial enlargement     Past Surgical History:  Procedure Laterality Date  . CARPAL TUNNEL RELEASE    . EYE SURGERY      Family History  Problem Relation Age of Onset  . Heart failure Mother   . Hypertension Mother   . Hyperlipidemia Mother   . Heart disease Mother   . Emphysema Father   . Hypertension Brother   . Cancer Sister        "blood cancer"  . Hypertension Sister   . Hypertension Sister   . Hypertension Sister   . Hypertension Sister   . Hypertension Sister   . Hypertension Brother     Social History   Socioeconomic History  . Marital status: Divorced    Spouse name: N/A  . Number of children: 0  . Years of education: 76  . Highest education level: Not on file  Occupational History  . Occupation: Licensed conveyancer  Tobacco Use  . Smoking status: Former Smoker    Packs/day: 0.50    Types: Cigarettes    Start date:  08/11/1969    Quit date: 08/11/1984    Years since quitting: 35.1  . Smokeless tobacco: Never Used  Substance and Sexual Activity  . Alcohol use: Yes    Alcohol/week: 3.0 standard drinks    Types: 3 Standard drinks or equivalent per week  . Drug use: No    Comment: previously used marijuana  . Sexual activity: Never  Other Topics Concern  . Not on file  Social History Narrative   Lives alone.   Social Determinants of Health   Financial Resource Strain:   . Difficulty of Paying Living Expenses:   Food Insecurity:   . Worried About Programme researcher, broadcasting/film/video in the Last Year:   . Barista in the Last Year:   Transportation Needs:   . Freight forwarder (Medical):   Marland Kitchen Lack of Transportation (Non-Medical):   Physical Activity:   . Days of Exercise per Week:   . Minutes of Exercise per Session:   Stress:   . Feeling of Stress :   Social Connections:   . Frequency of Communication with Friends and Family:   . Frequency of Social Gatherings with Friends and Family:   . Attends Religious Services:   . Active Member of Clubs or Organizations:   . Attends Banker Meetings:   .  Marital Status:   Intimate Partner Violence:   . Fear of Current or Ex-Partner:   . Emotionally Abused:   Marland Kitchen Physically Abused:   . Sexually Abused:     Outpatient Medications Prior to Visit  Medication Sig Dispense Refill  . hydrochlorothiazide (HYDRODIURIL) 12.5 MG tablet Take 1 tablet (12.5 mg total) by mouth daily. 90 tablet 3   No facility-administered medications prior to visit.    Allergies  Allergen Reactions  . Neomycin-Polymyxin B Gu     rash  . Penicillins Rash    ROS Review of Systems  Constitutional: Negative.   HENT: Negative.   Eyes: Negative.   Respiratory: Negative.   Cardiovascular: Negative.   Gastrointestinal: Negative.   Endocrine: Negative.   Genitourinary: Negative.   Musculoskeletal: Negative.   Skin: Negative.   Allergic/Immunologic: Negative.     Neurological: Negative.   Hematological: Negative.   Psychiatric/Behavioral: Negative.   All other systems reviewed and are negative.     Objective:    Physical Exam  Constitutional: She is oriented to person, place, and time. She appears well-developed and well-nourished. No distress.  HENT:  Head: Normocephalic and atraumatic.  Right Ear: External ear normal.  Left Ear: External ear normal.  Nose: Nose normal.  Mouth/Throat: Oropharynx is clear and moist. No oropharyngeal exudate.  Eyes: Pupils are equal, round, and reactive to light. Conjunctivae and EOM are normal. Right eye exhibits no discharge. Left eye exhibits no discharge. No scleral icterus.  Neck: No JVD present. No tracheal deviation present. No thyromegaly present.  Cardiovascular: Normal rate, regular rhythm, normal heart sounds and intact distal pulses. Exam reveals no gallop and no friction rub.  No murmur heard. Pulmonary/Chest: Effort normal and breath sounds normal. No respiratory distress. She has no wheezes. She has no rales. She exhibits no tenderness.  Abdominal: Soft. Bowel sounds are normal. She exhibits no distension and no mass. There is no abdominal tenderness. There is no rebound and no guarding.  Musculoskeletal:        General: No tenderness, deformity or edema. Normal range of motion.     Cervical back: Normal range of motion and neck supple.  Lymphadenopathy:    She has no cervical adenopathy.  Neurological: She is alert and oriented to person, place, and time. No cranial nerve deficit. She exhibits normal muscle tone. Coordination normal.  Skin: Skin is warm. No rash noted. She is not diaphoretic. No erythema. No pallor.  Psychiatric: She has a normal mood and affect. Her behavior is normal. Judgment and thought content normal.  Nursing note and vitals reviewed.   BP (!) 154/80   Pulse 83   Temp 98.1 F (36.7 C) (Temporal)   Resp 17   Ht 5\' 1"  (1.549 m)   Wt 123 lb 3.2 oz (55.9 kg)   SpO2  98%   BMI 23.28 kg/m  Wt Readings from Last 3 Encounters:  10/03/19 123 lb 3.2 oz (55.9 kg)  09/15/19 119 lb 12.8 oz (54.3 kg)  09/12/19 121 lb 4.1 oz (55 kg)     Health Maintenance Due  Topic Date Due  . Hepatitis C Screening  Never done  . HIV Screening  Never done  . MAMMOGRAM  05/04/2013    There are no preventive care reminders to display for this patient.  Lab Results  Component Value Date   TSH 1.949 09/12/2019   Lab Results  Component Value Date   WBC 7.6 09/12/2019   HGB 13.1 09/12/2019   HCT 38.8  09/12/2019   MCV 93.3 09/12/2019   PLT 274 09/12/2019   Lab Results  Component Value Date   NA 132 (L) 09/12/2019   K 4.1 09/12/2019   CO2 24 09/12/2019   GLUCOSE 141 (H) 09/12/2019   BUN 9 09/12/2019   CREATININE 0.63 09/12/2019   BILITOT 1.0 09/12/2019   ALKPHOS 86 09/12/2019   AST 21 09/12/2019   ALT 15 09/12/2019   PROT 8.3 (H) 09/12/2019   ALBUMIN 5.0 09/12/2019   CALCIUM 9.3 09/12/2019   ANIONGAP 14 09/12/2019   No results found for: CHOL No results found for: HDL No results found for: LDLCALC No results found for: TRIG No results found for: CHOLHDL No results found for: HGBA1C    Assessment & Plan:   Problem List Items Addressed This Visit    None    Visit Diagnoses    Encounter to establish care    -  Primary   Screening mammogram, encounter for       Relevant Orders   MM Digital Diagnostic Bilat   Screening for viral disease       Relevant Orders   Hepatitis C antibody   HIV antibody (with reflex)   Lipid screening       Relevant Orders   Lipid Panel   Essential hypertension       Anxiety and depression       Relevant Medications   escitalopram (LEXAPRO) 5 MG tablet   escitalopram (LEXAPRO) 10 MG tablet      Meds ordered this encounter  Medications  . escitalopram (LEXAPRO) 5 MG tablet    Sig: Take 1 tablet (5 mg total) by mouth daily.    Dispense:  7 tablet    Refill:  0    Order Specific Question:   Supervising  Provider    Answer:   Carlota Raspberry, JEFFREY R [[id number]]  . escitalopram (LEXAPRO) 10 MG tablet    Sig: Take 1 tablet (10 mg total) by mouth daily.    Dispense:  90 tablet    Refill:  0    Order Specific Question:   Supervising Provider    Answer:   Carlota Raspberry, JEFFREY R [[id number]]    Follow-up: No follow-ups on file.   PLAN  Exam unremarkable  Continue hctz 12.5mg  PO qd  Start lexapro 5mg  PO qd, increase to 10mg  PO qd after one week. Return for med check in 6 weeks. Pt declines counseling at this time.  Will give second shingles vaccine in 6 weeks  Lipid panel, hep c, hiv drawn. Will follow up as warranted  Patient encouraged to call clinic with any questions, comments, or concerns.  Maximiano Coss, NP

## 2019-10-04 LAB — LIPID PANEL
Chol/HDL Ratio: 2.5 ratio (ref 0.0–4.4)
Cholesterol, Total: 183 mg/dL (ref 100–199)
HDL: 73 mg/dL (ref >39)
LDL Chol Calc (NIH): 88 mg/dL (ref 0–99)
Triglycerides: 130 mg/dL (ref 0–149)
VLDL Cholesterol Cal: 22 mg/dL (ref 5–40)

## 2019-10-04 LAB — HIV ANTIBODY (ROUTINE TESTING W REFLEX): HIV Screen 4th Generation wRfx: NONREACTIVE

## 2019-10-04 LAB — HEPATITIS C ANTIBODY: Hep C Virus Ab: <0.1 s/co ratio (ref 0.0–0.9)

## 2019-11-09 ENCOUNTER — Encounter: Payer: Self-pay | Admitting: Family Medicine

## 2019-11-09 ENCOUNTER — Ambulatory Visit: Payer: Self-pay | Attending: Internal Medicine

## 2019-11-09 ENCOUNTER — Other Ambulatory Visit: Payer: Self-pay

## 2019-11-09 ENCOUNTER — Ambulatory Visit (INDEPENDENT_AMBULATORY_CARE_PROVIDER_SITE_OTHER): Payer: 59 | Admitting: Family Medicine

## 2019-11-09 VITALS — BP 131/74 | HR 83 | Temp 97.7°F | Ht 61.0 in | Wt 123.4 lb

## 2019-11-09 DIAGNOSIS — L739 Follicular disorder, unspecified: Secondary | ICD-10-CM

## 2019-11-09 DIAGNOSIS — Z23 Encounter for immunization: Secondary | ICD-10-CM

## 2019-11-09 NOTE — Progress Notes (Signed)
   Covid-19 Vaccination Clinic  Name:  Rebekah Fischer    MRN: [id number] DOB: 1956-04-28  11/09/2019  [name] was observed post Covid-19 immunization for 15 minutes without incident. She was provided with Vaccine Information Sheet and instruction to access the V-Safe system.   [name] was instructed to call 911 with any severe reactions post vaccine: Marland Kitchen Difficulty breathing  . Swelling of face and throat  . A fast heartbeat  . A bad rash all over body  . Dizziness and weakness   Immunizations Administered    Name Date Dose VIS Date Route   JANSSEN COVID-19 VACCINE 11/09/2019 12:42 PM 0.5 mL 07/01/2019 Intramuscular   Manufacturer: [employer]   Lot: [id number]   NDC: 44967-591-63

## 2019-11-09 NOTE — Patient Instructions (Addendum)
  Use a little bit of Lotrimin (clotrimazole) cream and 1% hydrocortisone cream mixed together on that list area of skin 2 times a day for a week or so.  If symptoms continue to improve persist or get worse we can refer you to a dermatologist.   If you have lab work done today you will be contacted with your lab results within the next 2 weeks.  If you have not heard from [place] then please contact us. The fastest way to get your results is to register for My Chart.   IF you received an x-Rohm today, you will receive an invoice from [facility]. Please contact [facility] at [phone] with questions or concerns regarding your invoice.   IF you received labwork today, you will receive an invoice from [facility]. Please contact [facility] at [phone] with questions or concerns regarding your invoice.   Our billing staff will not be able to assist you with questions regarding bills from these companies.  You will be contacted with the lab results as soon as they are available. The fastest way to get your results is to activate your My Chart account. Instructions are located on the last page of this paperwork. If you have not heard from [place] regarding the results in 2 weeks, please contact this office.

## 2019-11-09 NOTE — Progress Notes (Signed)
Patient ID: Rebekah Fischer, female    DOB: 08/15/1955  Age: 64 y.o. MRN: [id number]  Chief Complaint  Patient presents with  . Rash    Pt stated that she has had a rash under her nose for the past 2 mos    Subjective:   For a month or so the patient has had a little follicular rash between her nose and her upper lip, some around the nasal ala.  Not up into the nose much.  None inside the lips.  She has a little cheilitis but she has had that for a long time.  Current allergies, medications, problem list, past/family and social histories reviewed.  Objective:  BP 131/74   Pulse 83   Temp 97.7 F (36.5 C) (Temporal)   Ht 5\' 1"  (1.549 m)   Wt 123 lb 6.4 oz (56 kg)   SpO2 99%   BMI 23.32 kg/m   No acute distress.  Mild follicular rashes noted with about 10 little bumps and a 1 and half centimeter diameter area apparently it itches a little bit.  She does have a history of having had fever blisters in the past, but does not feel like those.  Assessment & Plan:   Assessment: 1. Folliculitis       Plan: See instructions.  This is a nonspecific rash.  No orders of the defined types were placed in this encounter.   No orders of the defined types were placed in this encounter.        Patient Instructions    Use a little bit of Lotrimin (clotrimazole) cream and 1% hydrocortisone cream mixed together on that list area of skin 2 times a day for a week or so.  If symptoms continue to improve persist or get worse we can refer you to a dermatologist.   If you have lab work done today you will be contacted with your lab results within the next 2 weeks.  If you have not heard from then please contact us. The fastest way to get your results is to register for My Chart.   IF you received an x-Silverman today, you will receive an invoice from [facility]. Please contact [facility] at [phone] with questions or concerns regarding your invoice.   IF you received  labwork today, you will receive an invoice from [facility]. Please contact [facility] at [phone] with questions or concerns regarding your invoice.   Our billing staff will not be able to assist you with questions regarding bills from these companies.  You will be contacted with the lab results as soon as they are available. The fastest way to get your results is to activate your My Chart account. Instructions are located on the last page of this paperwork. If you have not heard from [phone] regarding the results in 2 weeks, please contact this office.        Return if symptoms worsen or fail to improve.   [place], MD 11/09/2019

## 2019-12-08 ENCOUNTER — Ambulatory Visit: Payer: 59 | Admitting: Registered Nurse

## 2019-12-12 ENCOUNTER — Other Ambulatory Visit: Payer: Self-pay | Admitting: Registered Nurse

## 2019-12-12 DIAGNOSIS — I10 Essential (primary) hypertension: Secondary | ICD-10-CM

## 2019-12-12 MED ORDER — HYDROCHLOROTHIAZIDE 12.5 MG PO TABS: 12.5000 mg | ORAL_TABLET | Freq: Every day | ORAL | 2 refills | Status: DC

## 2019-12-12 NOTE — Telephone Encounter (Signed)
Change in pharmacy- remainder of RF forwarded

## 2019-12-12 NOTE — Telephone Encounter (Signed)
Medication Refill - Medication: hydrochlorothiazide   Has the patient contacted their pharmacy? Yes.   (Agent: If no, request that the patient contact the pharmacy for the refill.) (Agent: If yes, when and what did the pharmacy advise?)  Preferred Pharmacy (with phone number or street name):  [facility] [address] [place] - [address]  [address] [place] [address]  Phone: [phone] Fax: [phone]  Hours: Not open 24 hours     Agent: Please be advised that RX refills may take up to 3 business days. We ask that you follow-up with your pharmacy.

## 2019-12-13 ENCOUNTER — Other Ambulatory Visit: Payer: Self-pay

## 2019-12-13 ENCOUNTER — Ambulatory Visit (INDEPENDENT_AMBULATORY_CARE_PROVIDER_SITE_OTHER): Payer: 59 | Admitting: Registered Nurse

## 2019-12-13 ENCOUNTER — Ambulatory Visit: Payer: 59 | Admitting: Registered Nurse

## 2019-12-13 ENCOUNTER — Encounter: Payer: Self-pay | Admitting: Registered Nurse

## 2019-12-13 DIAGNOSIS — M25521 Pain in right elbow: Secondary | ICD-10-CM

## 2019-12-13 DIAGNOSIS — M25522 Pain in left elbow: Secondary | ICD-10-CM

## 2019-12-13 DIAGNOSIS — M542 Cervicalgia: Secondary | ICD-10-CM | POA: Diagnosis not present

## 2019-12-13 DIAGNOSIS — Z23 Encounter for immunization: Secondary | ICD-10-CM | POA: Diagnosis not present

## 2019-12-13 DIAGNOSIS — M25511 Pain in right shoulder: Secondary | ICD-10-CM | POA: Diagnosis not present

## 2019-12-13 MED ORDER — METHOCARBAMOL 500 MG PO TABS: 500.0000 mg | ORAL_TABLET | Freq: Four times a day (QID) | ORAL | 0 refills | Status: DC

## 2019-12-13 MED ORDER — DICLOFENAC SODIUM 75 MG PO TBEC: 75.0000 mg | DELAYED_RELEASE_TABLET | Freq: Two times a day (BID) | ORAL | 0 refills | Status: DC

## 2019-12-13 NOTE — Patient Instructions (Signed)
° ° ° °  If you have lab work done today you will be contacted with your lab results within the next 2 weeks.  If you have not heard from us then please contact us. The fastest way to get your results is to register for My Chart. ° ° °IF you received an x-Salvucci today, you will receive an invoice from [facility]. Please contact  Radiology at [phone] with questions or concerns regarding your invoice.  ° °IF you received labwork today, you will receive an invoice from [facility]. Please contact [facility] at [phone] with questions or concerns regarding your invoice.  ° °Our billing staff will not be able to assist you with questions regarding bills from these companies. ° °You will be contacted with the lab results as soon as they are available. The fastest way to get your results is to activate your My Chart account. Instructions are located on the last page of this paperwork. If you have not heard from us regarding the results in 2 weeks, please contact this office. °  ° ° ° °

## 2019-12-29 ENCOUNTER — Other Ambulatory Visit: Payer: Self-pay | Admitting: Registered Nurse

## 2019-12-29 DIAGNOSIS — F419 Anxiety disorder, unspecified: Secondary | ICD-10-CM

## 2019-12-29 NOTE — Telephone Encounter (Signed)
Patient is requesting a refill of the following medications: Requested Prescriptions   Pending Prescriptions Disp Refills  . escitalopram (LEXAPRO) 10 MG tablet [Pharmacy Med Name: ESCITALOPRAM 10MG  TABLETS] 90 tablet 0    Sig: TAKE 1 TABLET(10 MG) BY MOUTH DAILY    Date of patient request: 12/29/2019 Last office visit: 12/13/2019 Date of last refill:10/03/2019 Last refill amount: 90 tablets Follow up time period per chart: 01/04/2020

## 2019-12-29 NOTE — Telephone Encounter (Signed)
Patient is requesting a refill of the following medications: Requested Prescriptions   Pending Prescriptions Disp Refills  . escitalopram (LEXAPRO) 10 MG tablet [Pharmacy Med Name: ESCITALOPRAM 10MG  TABLETS] 90 tablet 0    Sig: TAKE 1 TABLET(10 MG) BY MOUTH DAILY

## 2020-01-04 ENCOUNTER — Ambulatory Visit: Payer: 59 | Admitting: Family Medicine

## 2020-03-02 ENCOUNTER — Encounter: Payer: Self-pay | Admitting: Registered Nurse
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
## 2020-03-14 ENCOUNTER — Telehealth: Payer: Self-pay | Admitting: *Deleted

## 2020-03-14 NOTE — Telephone Encounter (Signed)
Schedule a mammogram  

## 2020-03-15 ENCOUNTER — Emergency Department (HOSPITAL_COMMUNITY): Payer: 59

## 2020-03-15 ENCOUNTER — Encounter (HOSPITAL_COMMUNITY): Payer: Self-pay | Admitting: Emergency Medicine

## 2020-03-15 ENCOUNTER — Emergency Department (HOSPITAL_COMMUNITY)
Admission: EM | Admit: 2020-03-15 | Discharge: 2020-03-15 | Disposition: A | Discharge status: Home / Self Care | Payer: 59 | Attending: Emergency Medicine | Admitting: Emergency Medicine

## 2020-03-15 ENCOUNTER — Other Ambulatory Visit: Payer: Self-pay

## 2020-03-15 DIAGNOSIS — E871 Hypo-osmolality and hyponatremia: Secondary | ICD-10-CM

## 2020-03-15 DIAGNOSIS — W19XXXA Unspecified fall, initial encounter: Secondary | ICD-10-CM | POA: Diagnosis not present

## 2020-03-15 DIAGNOSIS — Z87891 Personal history of nicotine dependence: Secondary | ICD-10-CM | POA: Diagnosis not present

## 2020-03-15 DIAGNOSIS — S0012XA Contusion of left eyelid and periocular area, initial encounter: Secondary | ICD-10-CM | POA: Insufficient documentation

## 2020-03-15 DIAGNOSIS — S0993XA Unspecified injury of face, initial encounter: Secondary | ICD-10-CM | POA: Diagnosis not present

## 2020-03-15 LAB — BASIC METABOLIC PANEL
Anion gap: 14 (ref 5–15)
BUN: 12 mg/dL (ref 8–23)
CO2: 24 mmol/L (ref 22–32)
Calcium: 9.1 mg/dL (ref 8.9–10.3)
Chloride: 92 mmol/L — ABNORMAL LOW (ref 98–111)
Creatinine, Ser: 0.71 mg/dL (ref 0.44–1.00)
GFR, Estimated: >60 mL/min (ref >60)
Glucose, Bld: 108 mg/dL — ABNORMAL HIGH (ref 70–99)
Potassium: 3.8 mmol/L (ref 3.5–5.1)
Sodium: 130 mmol/L — ABNORMAL LOW (ref 135–145)

## 2020-03-15 LAB — URINALYSIS, ROUTINE W REFLEX MICROSCOPIC
Bilirubin Urine: NEGATIVE
Glucose, UA: NEGATIVE mg/dL
Hgb urine dipstick: NEGATIVE
Ketones, ur: 5 mg/dL — AB
Leukocytes,Ua: NEGATIVE
Nitrite: NEGATIVE
Protein, ur: NEGATIVE mg/dL
Specific Gravity, Urine: 1.006 (ref 1.005–1.030)
pH: 6 (ref 5.0–8.0)

## 2020-03-15 LAB — CBC
HCT: 40.1 % (ref 36.0–46.0)
Hemoglobin: 13.8 g/dL (ref 12.0–15.0)
MCH: 31.9 pg (ref 26.0–34.0)
MCHC: 34.4 g/dL (ref 30.0–36.0)
MCV: 92.6 fL (ref 80.0–100.0)
Platelets: 294 10*3/uL (ref 150–400)
RBC: 4.33 MIL/uL (ref 3.87–5.11)
RDW: 11.6 % (ref 11.5–15.5)
WBC: 7.2 10*3/uL (ref 4.0–10.5)
nRBC: 0 % (ref 0.0–0.2)

## 2020-03-15 MED ORDER — ACETAMINOPHEN 325 MG PO TABS
650.0000 mg | ORAL_TABLET | Freq: Once | ORAL | Status: AC
  Administered 2020-03-15: 650 mg via ORAL
  Filled 2020-03-15: qty 2

## 2020-03-15 MED ORDER — HYDROCHLOROTHIAZIDE 25 MG PO TABS
25.0000 mg | ORAL_TABLET | Freq: Once | ORAL | Status: AC
  Administered 2020-03-15: 25 mg via ORAL
  Filled 2020-03-15: qty 1

## 2020-03-15 NOTE — ED Triage Notes (Addendum)
Pt states that her sister passed away yesterday, while on the phone with her nephew, she stood up and doesn't remember what happened, states that she woke up sitting on the couch, feeling dizzy. She reports that she got up and went to bed, however, this morning upon waking she noticed swelling and bruising to her L eye. A/ox4, denies blood thinners. Significant swelling and bruising to L eye, denies visual changes but states she has to physically pull her lids open to see.

## 2020-03-15 NOTE — Discharge Instructions (Addendum)
Please follow up with your family doctor in the next week for recheck. Your sodium was mildly low today at 130.

## 2020-03-15 NOTE — ED Notes (Signed)
Pt taken to CT.

## 2020-03-15 NOTE — ED Provider Notes (Signed)
[facility] EMERGENCY DEPARTMENT Provider Note   CSN: [id number] Arrival date & time: 03/15/20  1217     History Chief Complaint  Patient presents with  . Fall  . Eye Problem    CLARITY CISZEK is a 64 y.o. female.  HPI Patient presents with her sister who assists with the HPI. Patient is here 1 day after sustaining an injury to her left face.  Patient was on the telephone yesterday, speaking with a family member who informed her that her other sister died.  She recalls awakening on the couch, noticing swelling, pain about the left periorbital area.  She does not recall chest pain either before or after the event.  Currently she denies any weakness in any extremity, confusion, lightheadedness.  She cannot see from the eye, as it is swollen shut, but when pried open she denies vision changes, does not recall any yesterday following a fall. Today, with increased swelling, discoloration on her left face that she presents for evaluation.   Past Medical History:  Diagnosis Date  . Allergy   . Right atrial enlargement     Patient Active Problem List   Diagnosis Date Noted  . Adjustment disorder with mixed anxiety and depressed mood 09/13/2019    Past Surgical History:  Procedure Laterality Date  . CARPAL TUNNEL RELEASE    . EYE SURGERY       OB History   No obstetric history on file.     Family History  Problem Relation Age of Onset  . Heart failure Mother   . Hypertension Mother   . Hyperlipidemia Mother   . Heart disease Mother   . Emphysema Father   . Hypertension Brother   . Cancer Sister        "blood cancer"  . Hypertension Sister   . Hypertension Sister   . Hypertension Sister   . Hypertension Sister   . Hypertension Sister   . Hypertension Brother     Social History   Tobacco Use  . Smoking status: Former Smoker    Packs/day: 0.50    Types: Cigarettes    Start date: 08/11/1969    Quit date: 08/11/1984    Years since quitting: 35.6    . Smokeless tobacco: Never Used  Vaping Use  . Vaping Use: Never used  Substance Use Topics  . Alcohol use: Yes    Alcohol/week: 3.0 standard drinks    Types: 3 Standard drinks or equivalent per week  . Drug use: No    Comment: previously used marijuana    Home Medications Prior to Admission medications   Medication Sig Start Date End Date Taking? Authorizing Provider  diclofenac (VOLTAREN) 75 MG EC tablet Take 1 tablet (75 mg total) by mouth 2 (two) times daily. 12/13/19   Janeece Agee, NP  escitalopram (LEXAPRO) 10 MG tablet TAKE 1 TABLET(10 MG) BY MOUTH DAILY 12/29/19   Janeece Agee, NP  hydrochlorothiazide (HYDRODIURIL) 12.5 MG tablet Take 1 tablet (12.5 mg total) by mouth daily. 12/12/19   Peyton Najjar, MD  methocarbamol (ROBAXIN) 500 MG tablet Take 1 tablet (500 mg total) by mouth 4 (four) times daily. 12/13/19   Janeece Agee, NP    Allergies    Neomycin-polymyxin b gu and Penicillins  Review of Systems   Review of Systems  Constitutional:       Per HPI, otherwise negative  HENT:       Per HPI, otherwise negative  Respiratory:  Per HPI, otherwise negative  Cardiovascular:       Per HPI, otherwise negative  Gastrointestinal: Negative for vomiting.  Endocrine:       Negative aside from HPI  Genitourinary:       Neg aside from HPI   Musculoskeletal:       Per HPI, otherwise negative  Skin: Positive for color change.  Neurological: Positive for light-headedness. Negative for syncope.    Physical Exam Updated Vital Signs BP (!) 183/92   Pulse 96   Temp 99.1 F (37.3 C) (Oral)   Resp 14   Ht 5\' 1"  (1.549 m)   Wt 55.3 kg   SpO2 99%   BMI 23.05 kg/m   Physical Exam Vitals and nursing note reviewed.  Constitutional:      General: She is not in acute distress.    Appearance: She is well-developed.  HENT:     Head: Normocephalic.   Eyes:     Conjunctiva/sclera: Conjunctivae normal.  Cardiovascular:     Rate and Rhythm: Normal rate and  regular rhythm.  Pulmonary:     Effort: Pulmonary effort is normal. No respiratory distress.     Breath sounds: Normal breath sounds. No stridor.  Abdominal:     General: There is no distension.  Musculoskeletal:        General: No deformity.     Cervical back: Normal range of motion and neck supple. No spinous process tenderness or muscular tenderness.  Skin:    General: Skin is warm and dry.  Neurological:     Mental Status: She is alert and oriented to person, place, and time.     Cranial Nerves: No cranial nerve deficit.     Motor: No weakness.      ED Results / Procedures / Treatments   Labs (all labs ordered are listed, but only abnormal results are displayed) Labs Reviewed  BASIC METABOLIC PANEL - Abnormal; Notable for the following components:      Result Value   Sodium 130 (*)    Chloride 92 (*)    Glucose, Bld 108 (*)    All other components within normal limits  CBC  URINALYSIS, ROUTINE W REFLEX MICROSCOPIC  CBG MONITORING, ED    EKG EKG Interpretation  Date/Time:  Friday March 15 2020 12:26:04 EST Ventricular Rate:  116 PR Interval:  122 QRS Duration: 88 QT Interval:  358 QTC Calculation: 497 R Axis:   67 Text Interpretation: Sinus tachycardia Nonspecific ST abnormality Abnormal ECG Confirmed by 12[birth year]28[birth year]1981 [phone]) on 03/15/2020 1:50:20 PM   Radiology No results found.  Procedures Procedures (including critical care time)  Medications Ordered in ED Medications  hydrochlorothiazide (HYDRODIURIL) tablet 25 mg (has no administration in time range)    ED Course  I have reviewed the triage vital signs and the nursing notes.  Pertinent labs & imaging results that were available during my care of the patient were reviewed by me and considered in my medical decision making (see chart for details).    MDM Rules/Calculators/A&P Patient presents due to concern of substantial facial swelling, ecchymosis, around the left periorbital  region. Patient is awake, alert, interactive, is otherwise neurologically unremarkable, denies vision changes, but with concern for facial trauma, is awaiting CT imaging to exclude intracranial or osseous abnormalities. Patient had hypertension on arrival, possibly due to stress of recent death of her sister versus absence of medication additions today due to fall. Patient received antihypertensive medication here, will require CT imaging, repeat evaluation.  Dr. Madilyn Hook is aware of the patient.  Final Clinical Impression(s) / ED Diagnoses Final diagnoses:  Fall, initial encounter  Facial injury, initial encounter   MDM Number of Diagnoses or Management Options Facial injury, initial encounter: new, needed workup Fall, initial encounter: new, needed workup   Amount and/or Complexity of Data Reviewed Clinical lab tests: reviewed Tests in the radiology section of CPT: ordered Tests in the medicine section of CPT: reviewed Decide to obtain previous medical records or to obtain history from someone other than the patient: yes Obtain history from someone other than the patient: yes Review and summarize past medical records: yes Discuss the patient with other providers: yes  Risk of Complications, Morbidity, and/or Mortality Presenting problems: high Diagnostic procedures: high Management options: high  Critical Care Total time providing critical care: < 30 minutes  Patient Progress Patient progress: stable    Gerhard Munch, MD 03/15/20 1546

## 2020-03-15 NOTE — ED Notes (Signed)
Notified Dr. Jeraldine Loots of pt's elevated BP's with systolic values in the 190s and 200s. No new orders at this time.

## 2020-03-15 NOTE — ED Provider Notes (Signed)
Patient care assumed at 1500  Patient here for evaluation of injuries following a mechanical fall. She has a hematoma to her face. CT scan is pending.  CT scan demonstrates hematoma, no evidence of intracranial abnormality. BMP with mild hyponatremia, patient is asymptomatic this time. Discussed with patient home care for hematoma. Discussed PCP follow-up regarding hyponatremia.   Tilden Fossa, MD 03/15/20 1925

## 2020-03-19 ENCOUNTER — Ambulatory Visit (INDEPENDENT_AMBULATORY_CARE_PROVIDER_SITE_OTHER): Payer: 59 | Admitting: Registered Nurse

## 2020-03-19 ENCOUNTER — Other Ambulatory Visit: Payer: Self-pay

## 2020-03-19 ENCOUNTER — Encounter: Payer: Self-pay | Admitting: Registered Nurse

## 2020-03-19 VITALS — BP 184/79 | HR 96 | Temp 97.9°F | Resp 18 | Ht 61.0 in | Wt 126.0 lb

## 2020-03-19 DIAGNOSIS — I10 Essential (primary) hypertension: Secondary | ICD-10-CM | POA: Diagnosis not present

## 2020-03-19 LAB — BASIC METABOLIC PANEL
BUN/Creatinine Ratio: 10 — ABNORMAL LOW (ref 12–28)
BUN: 8 mg/dL (ref 8–27)
CO2: 26 mmol/L (ref 20–29)
Calcium: 9.6 mg/dL (ref 8.7–10.3)
Chloride: 88 mmol/L — ABNORMAL LOW (ref 96–106)
Creatinine, Ser: 0.79 mg/dL (ref 0.57–1.00)
GFR calc Af Amer: 92 mL/min/{1.73_m2} (ref >59)
GFR calc non Af Amer: 80 mL/min/{1.73_m2} (ref >59)
Glucose: 97 mg/dL (ref 65–99)
Potassium: 4.2 mmol/L (ref 3.5–5.2)
Sodium: 132 mmol/L — ABNORMAL LOW (ref 134–144)

## 2020-03-19 LAB — CBC
Hematocrit: 37.7 % (ref 34.0–46.6)
Hemoglobin: 12.7 g/dL (ref 11.1–15.9)
MCH: 31.8 pg (ref 26.6–33.0)
MCHC: 33.7 g/dL (ref 31.5–35.7)
MCV: 94 fL (ref 79–97)
Platelets: 302 10*3/uL (ref 150–450)
RBC: 4 x10E6/uL (ref 3.77–5.28)
RDW: 11.7 % (ref 11.7–15.4)
WBC: 7.1 10*3/uL (ref 3.4–10.8)

## 2020-03-19 MED ORDER — LISINOPRIL-HYDROCHLOROTHIAZIDE 20-12.5 MG PO TABS: 1.0000 | ORAL_TABLET | Freq: Every day | ORAL | 3 refills | Status: DC

## 2020-03-19 NOTE — Patient Instructions (Signed)
° ° ° °  If you have lab work done today you will be contacted with your lab results within the next 2 weeks.  If you have not heard from us then please contact us. The fastest way to get your results is to register for My Chart. ° ° °IF you received an x-Brallier today, you will receive an invoice from [facility]. Please contact [facility] at [phone] with questions or concerns regarding your invoice.  ° °IF you received labwork today, you will receive an invoice from [facility]. Please contact [facility] at [phone] with questions or concerns regarding your invoice.  ° °Our billing staff will not be able to assist you with questions regarding bills from these companies. ° °You will be contacted with the lab results as soon as they are available. The fastest way to get your results is to activate your My Chart account. Instructions are located on the last page of this paperwork. If you have not heard from us regarding the results in 2 weeks, please contact this office. °  ° ° ° °

## 2020-03-20 LAB — URINALYSIS
Bilirubin, UA: NEGATIVE
Glucose, UA: NEGATIVE
Ketones, UA: NEGATIVE
Nitrite, UA: NEGATIVE
Protein,UA: NEGATIVE
RBC, UA: NEGATIVE
Specific Gravity, UA: 1.009 (ref 1.005–1.030)
Urobilinogen, Ur: 0.2 mg/dL (ref 0.2–1.0)
pH, UA: 7 (ref 5.0–7.5)

## 2020-03-22 ENCOUNTER — Telehealth: Payer: Self-pay

## 2020-03-22 DIAGNOSIS — Z1211 Encounter for screening for malignant neoplasm of colon: Secondary | ICD-10-CM

## 2020-03-22 NOTE — Telephone Encounter (Signed)
Pt. Called requesting colleguard test. Pt. Asserts it had been discussed on her last visit

## 2020-03-22 NOTE — Telephone Encounter (Signed)
Called pt and informed her that I will order the kit for her. I will also get the provider to sign the order.  °

## 2020-04-15 ENCOUNTER — Ambulatory Visit (INDEPENDENT_AMBULATORY_CARE_PROVIDER_SITE_OTHER): Payer: 59 | Admitting: Registered Nurse

## 2020-04-15 ENCOUNTER — Other Ambulatory Visit: Payer: Self-pay | Admitting: Registered Nurse

## 2020-04-15 ENCOUNTER — Other Ambulatory Visit: Payer: Self-pay

## 2020-04-15 VITALS — BP 140/60 | HR 84

## 2020-04-15 DIAGNOSIS — Z0130 Encounter for examination of blood pressure without abnormal findings: Secondary | ICD-10-CM

## 2020-04-15 DIAGNOSIS — I10 Essential (primary) hypertension: Secondary | ICD-10-CM

## 2020-04-15 MED ORDER — LISINOPRIL-HYDROCHLOROTHIAZIDE 20-25 MG PO TABS: 1.0000 | ORAL_TABLET | Freq: Every day | ORAL | 3 refills | Status: DC

## 2020-04-15 NOTE — Progress Notes (Signed)
Pt come to the office today to get her BP checked. It was 149/74 HR: 84  I did do a manual BP check as well and it was 140/60.  Spoke to provider and med was increased to 20-25 of Lisinopril HCTZ since he bp is on the boarder line of being controled.   Pt is to make a f/u appt in mid jan and she has made one on the way out.

## 2020-04-17 LAB — COLOGUARD

## 2020-04-18 LAB — COLOGUARD: Cologuard: NEGATIVE

## 2020-05-09 LAB — COLOGUARD: COLOGUARD: NEGATIVE

## 2020-05-17 ENCOUNTER — Telehealth: Payer: Self-pay | Admitting: Registered Nurse

## 2020-05-17 NOTE — Telephone Encounter (Signed)
called pt LVM for pateitn that appt  For 03/20/2021 will be changed to vitual

## 2020-05-20 ENCOUNTER — Telehealth (INDEPENDENT_AMBULATORY_CARE_PROVIDER_SITE_OTHER): Payer: 59 | Admitting: Registered Nurse

## 2020-05-20 ENCOUNTER — Other Ambulatory Visit: Payer: Self-pay

## 2020-05-20 DIAGNOSIS — F32A Depression, unspecified: Secondary | ICD-10-CM

## 2020-05-20 DIAGNOSIS — I10 Essential (primary) hypertension: Secondary | ICD-10-CM | POA: Diagnosis not present

## 2020-05-20 DIAGNOSIS — F419 Anxiety disorder, unspecified: Secondary | ICD-10-CM

## 2020-05-20 MED ORDER — LISINOPRIL-HYDROCHLOROTHIAZIDE 20-25 MG PO TABS: 1.0000 | ORAL_TABLET | Freq: Every day | ORAL | 3 refills | Status: DC

## 2020-05-20 MED ORDER — ESCITALOPRAM OXALATE 10 MG PO TABS: ORAL_TABLET | ORAL | 0 refills | Status: DC

## 2020-05-20 NOTE — Patient Instructions (Signed)
° ° ° °  If you have lab work done today you will be contacted with your lab results within the next 2 weeks.  If you have not heard from us then please contact us. The fastest way to get your results is to register for My Chart. ° ° °IF you received an x-Lynds today, you will receive an invoice from [facility]. Please contact [facility] at [phone] with questions or concerns regarding your invoice.  ° °IF you received labwork today, you will receive an invoice from [facility]. Please contact [facility] at [phone] with questions or concerns regarding your invoice.  ° °Our billing staff will not be able to assist you with questions regarding bills from these companies. ° °You will be contacted with the lab results as soon as they are available. The fastest way to get your results is to activate your My Chart account. Instructions are located on the last page of this paperwork. If you have not heard from us regarding the results in 2 weeks, please contact this office. °  ° ° ° °

## 2020-05-21 NOTE — Progress Notes (Signed)
Telemedicine Encounter- SOAP NOTE Established Patient  This telephone encounter was conducted with the patient's (or proxy's) verbal consent via audio telecommunications: yes  Patient was instructed to have this encounter in a suitably private space; and to only have persons present to whom they give permission to participate. In addition, patient identity was confirmed by use of name plus two identifiers (DOB and address).  I discussed the limitations, risks, security and privacy concerns of performing an evaluation and management service by telephone and the availability of in person appointments. I also discussed with the patient that there may be a patient responsible charge related to this service. The patient expressed understanding and agreed to proceed.  I spent a total of 15 minutes talking with the patient or their proxy.  Patient at home Provider in office  Chief Complaint  Patient presents with  . Hypertension    Pt doing well denies physical symptoms, denies side effects from medication unless she does not eat first.     Subjective   Rebekah Fischer is a 65 y.o. established patient. Telephone visit today for htn  HPI Hypertension: Patient Currently taking: lisinopril-hctz 20-25mg  PO qd Good effect. No AEs. Denies CV symptoms including: chest pain, shob, doe, headache, visual changes, fatigue, claudication, and dependent edema.   Previous readings and labs: BP Readings from Last 3 Encounters:  05/20/20 (!) 146/83  04/15/20 140/60  03/19/20 (!) 184/79   Lab Results  Component Value Date   CREATININE 0.79 03/19/2020    Depression/anxiety: Taking lexapro 10mg  PO qhs Good effect, no AEs Needs refill Denies hi/si  Patient Active Problem List   Diagnosis Date Noted  . Adjustment disorder with mixed anxiety and depressed mood 09/13/2019    Past Medical History:  Diagnosis Date  . Allergy   . Right atrial enlargement     Current Outpatient Medications   Medication Sig Dispense Refill  . escitalopram (LEXAPRO) 10 MG tablet TAKE 1 TABLET(10 MG) BY MOUTH DAILY 90 tablet 0  . lisinopril-hydrochlorothiazide (ZESTORETIC) 20-25 MG tablet Take 1 tablet by mouth daily. 90 tablet 3   No current facility-administered medications for this visit.    Allergies  Allergen Reactions  . Neomycin-Polymyxin B Gu     rash  . Penicillins Rash    Social History   Socioeconomic History  . Marital status: Divorced    Spouse name: N/A  . Number of children: 0  . Years of education: 53  . Highest education level: Not on file  Occupational History  . Occupation: 12  Tobacco Use  . Smoking status: Former Smoker    Packs/day: 0.50    Types: Cigarettes    Start date: 08/11/1969    Quit date: 08/11/1984    Years since quitting: 35.8  . Smokeless tobacco: Never Used  Vaping Use  . Vaping Use: Never used  Substance and Sexual Activity  . Alcohol use: Yes    Alcohol/week: 3.0 standard drinks    Types: 3 Standard drinks or equivalent per week  . Drug use: No    Comment: previously used marijuana  . Sexual activity: Never  Other Topics Concern  . Not on file  Social History Narrative   Lives alone.   Social Determinants of Health   Financial Resource Strain: Not on file  Food Insecurity: Not on file  Transportation Needs: Not on file  Physical Activity: Not on file  Stress: Not on file  Social Connections: Not on file  Intimate Partner Violence:  Not on file    Review of Systems  Constitutional: Negative.   HENT: Negative.   Eyes: Negative.   Respiratory: Negative.   Cardiovascular: Negative.   Gastrointestinal: Negative.   Genitourinary: Negative.   Musculoskeletal: Negative.   Skin: Negative.   Neurological: Negative.   Endo/Heme/Allergies: Negative.   Psychiatric/Behavioral: Negative.   All other systems reviewed and are negative.   Objective   Vitals as reported by the patient: Today's Vitals   05/20/20 0858   BP: (!) 146/83    Rebekah Fischer was seen today for hypertension.  Diagnoses and all orders for this visit:  Anxiety and depression -     escitalopram (LEXAPRO) 10 MG tablet; TAKE 1 TABLET(10 MG) BY MOUTH DAILY  Essential hypertension -     lisinopril-hydrochlorothiazide (ZESTORETIC) 20-25 MG tablet; Take 1 tablet by mouth daily.   PLAN  Refill meds  Present for visit in office in 6 mo  Patient encouraged to call clinic with any questions, comments, or concerns.  I discussed the assessment and treatment plan with the patient. The patient was provided an opportunity to ask questions and all were answered. The patient agreed with the plan and demonstrated an understanding of the instructions.   The patient was advised to call back or seek an in-person evaluation if the symptoms worsen or if the condition fails to improve as anticipated.  I provided 15 minutes of non-face-to-face time during this encounter.  Janeece Agee, NP  Primary Care at [facility]

## 2020-05-27 ENCOUNTER — Encounter: Payer: Self-pay | Admitting: Registered Nurse

## 2020-05-27 NOTE — Progress Notes (Signed)
Established Patient Office Visit  Subjective:  Patient ID: Rebekah Fischer, female    DOB: 09-21-1955  Age: 65 y.o. MRN: [id number]  CC:  Chief Complaint  Patient presents with  . Hypertension    Patient states she is here for an BP check and discuss medicatons.    HPI Zaylei E Mcknight presents for htn  Hypertension: Patient Currently taking: hctz 12.5 PO qd Good effect. No AEs. Denies CV symptoms including: chest pain, shob, doe, headache, visual changes, fatigue, claudication, and dependent edema.   Previous readings and labs: BP Readings from Last 3 Encounters:  05/20/20 (!) 146/83  04/15/20 140/60  03/19/20 (!) 184/79   Lab Results  Component Value Date   CREATININE 0.79 03/19/2020    Still elevated in office today. Pt is stressed and concerned - a lot of emotional stress this year. States this is likely affecting BP.     Past Medical History:  Diagnosis Date  . Allergy   . Right atrial enlargement     Past Surgical History:  Procedure Laterality Date  . CARPAL TUNNEL RELEASE    . EYE SURGERY      Family History  Problem Relation Age of Onset  . Heart failure Mother   . Hypertension Mother   . Hyperlipidemia Mother   . Heart disease Mother   . Emphysema Father   . Hypertension Brother   . Cancer Sister        "blood cancer"  . Hypertension Sister   . Hypertension Sister   . Hypertension Sister   . Hypertension Sister   . Hypertension Sister   . Hypertension Brother     Social History   Socioeconomic History  . Marital status: Divorced    Spouse name: N/A  . Number of children: 0  . Years of education: 37  . Highest education level: Not on file  Occupational History  . Occupation: Licensed conveyancer  Tobacco Use  . Smoking status: Former Smoker    Packs/day: 0.50    Types: Cigarettes    Start date: 08/11/1969    Quit date: 08/11/1984    Years since quitting: 35.8  . Smokeless tobacco: Never Used  Vaping Use  . Vaping Use: Never used  Substance  and Sexual Activity  . Alcohol use: Yes    Alcohol/week: 3.0 standard drinks    Types: 3 Standard drinks or equivalent per week  . Drug use: No    Comment: previously used marijuana  . Sexual activity: Never  Other Topics Concern  . Not on file  Social History Narrative   Lives alone.   Social Determinants of Health   Financial Resource Strain: Not on file  Food Insecurity: Not on file  Transportation Needs: Not on file  Physical Activity: Not on file  Stress: Not on file  Social Connections: Not on file  Intimate Partner Violence: Not on file    Outpatient Medications Prior to Visit  Medication Sig Dispense Refill  . hydrochlorothiazide (HYDRODIURIL) 12.5 MG tablet Take 1 tablet (12.5 mg total) by mouth daily. 90 tablet 2  . diclofenac (VOLTAREN) 75 MG EC tablet Take 1 tablet (75 mg total) by mouth 2 (two) times daily. (Patient not taking: Reported on 03/19/2020) 30 tablet 0  . escitalopram (LEXAPRO) 10 MG tablet TAKE 1 TABLET(10 MG) BY MOUTH DAILY (Patient not taking: Reported on 03/19/2020) 90 tablet 0  . methocarbamol (ROBAXIN) 500 MG tablet Take 1 tablet (500 mg total) by mouth 4 (four) times daily. (  Patient not taking: Reported on 03/19/2020) 60 tablet 0   No facility-administered medications prior to visit.    Allergies  Allergen Reactions  . Neomycin-Polymyxin B Gu     rash  . Penicillins Rash    ROS Review of Systems  Constitutional: Negative.   HENT: Negative.   Eyes: Negative.   Respiratory: Negative.   Cardiovascular: Negative.   Gastrointestinal: Negative.   Genitourinary: Negative.   Musculoskeletal: Negative.   Skin: Negative.   Neurological: Negative.   Psychiatric/Behavioral: Negative.   All other systems reviewed and are negative.     Objective:    Physical Exam Vitals and nursing note reviewed.  Constitutional:      General: She is not in acute distress.    Appearance: Normal appearance. She is normal weight. She is not ill-appearing,  toxic-appearing or diaphoretic.  Cardiovascular:     Rate and Rhythm: Normal rate and regular rhythm.     Heart sounds: Normal heart sounds. No murmur heard. No friction rub. No gallop.   Pulmonary:     Effort: Pulmonary effort is normal. No respiratory distress.     Breath sounds: Normal breath sounds. No stridor. No wheezing, rhonchi or rales.  Chest:     Chest wall: No tenderness.  Skin:    General: Skin is warm and dry.  Neurological:     General: No focal deficit present.     Mental Status: She is alert and oriented to person, place, and time. Mental status is at baseline.  Psychiatric:        Mood and Affect: Mood normal.        Behavior: Behavior normal.        Thought Content: Thought content normal.        Judgment: Judgment normal.     BP (!) 184/79   Pulse 96   Temp 97.9 F (36.6 C) (Temporal)   Resp 18   Ht 5\' 1"  (1.549 m)   Wt 126 lb (57.2 kg)   SpO2 100%   BMI 23.81 kg/m  Wt Readings from Last 3 Encounters:  03/19/20 126 lb (57.2 kg)  03/15/20 122 lb (55.3 kg)  12/13/19 122 lb 6.4 oz (55.5 kg)     Health Maintenance Due  Topic Date Due  . MAMMOGRAM  05/04/2013  . PAP SMEAR-Modifier  02/11/2015    There are no preventive care reminders to display for this patient.  Lab Results  Component Value Date   TSH 1.949 09/12/2019   Lab Results  Component Value Date   WBC 7.1 03/19/2020   HGB 12.7 03/19/2020   HCT 37.7 03/19/2020   MCV 94 03/19/2020   PLT 302 03/19/2020   Lab Results  Component Value Date   NA 132 (L) 03/19/2020   K 4.2 03/19/2020   CO2 26 03/19/2020   GLUCOSE 97 03/19/2020   BUN 8 03/19/2020   CREATININE 0.79 03/19/2020   BILITOT 1.0 09/12/2019   ALKPHOS 86 09/12/2019   AST 21 09/12/2019   ALT 15 09/12/2019   PROT 8.3 (H) 09/12/2019   ALBUMIN 5.0 09/12/2019   CALCIUM 9.6 03/19/2020   ANIONGAP 14 03/15/2020   Lab Results  Component Value Date   CHOL 183 10/03/2019   Lab Results  Component Value Date   HDL 73  10/03/2019   Lab Results  Component Value Date   LDLCALC 88 10/03/2019   Lab Results  Component Value Date   TRIG 130 10/03/2019   Lab Results  Component Value Date  CHOLHDL 2.5 10/03/2019   No results found for: HGBA1C    Assessment & Plan:   Problem List Items Addressed This Visit   None   Visit Diagnoses    Essential hypertension    -  Primary   Relevant Orders   Urinalysis (Completed)   Basic Metabolic Panel (Completed)   CBC (Completed)      Meds ordered this encounter  Medications  . DISCONTD: lisinopril-hydrochlorothiazide (ZESTORETIC) 20-12.5 MG tablet    Sig: Take 1 tablet by mouth daily.    Dispense:  90 tablet    Refill:  3    Order Specific Question:   Supervising Provider    Answer:   Neva Seat, JEFFREY R [[id number]]    Follow-up: Return in about 4 weeks (around 04/16/2020) for nurse visit bp check.   PLAN  Increase meds to zestoretic 20-12.5mg  PO qd  Return in 4 weeks  Continue to work on coping mechanisms  Labs collected. Will follow up with the patient as warranted.  Patient encouraged to call clinic with any questions, comments, or concerns.  Janeece Agee, NP

## 2020-05-31 ENCOUNTER — Other Ambulatory Visit: Payer: 59

## 2020-05-31 DIAGNOSIS — Z20822 Contact with and (suspected) exposure to covid-19: Secondary | ICD-10-CM

## 2020-06-02 LAB — NOVEL CORONAVIRUS, NAA: SARS-CoV-2, NAA: NOT DETECTED

## 2020-06-02 LAB — SARS-COV-2, NAA 2 DAY TAT

## 2020-06-17 ENCOUNTER — Ambulatory Visit: Payer: 59 | Admitting: Registered Nurse

## 2020-06-18 ENCOUNTER — Encounter: Payer: Self-pay | Admitting: Registered Nurse

## 2020-06-24 ENCOUNTER — Telehealth: Payer: Self-pay

## 2020-06-24 NOTE — Telephone Encounter (Signed)
Pt. Called to assert she had been told not to come in on the 14th for inclement weather. She did not come in and is being charged 25$ for not showing. Pt. Requested waiving the fee

## 2020-06-25 NOTE — Telephone Encounter (Signed)
Charge will be waived

## 2020-09-12 ENCOUNTER — Encounter (HOSPITAL_COMMUNITY): Payer: Self-pay | Admitting: Registered Nurse

## 2020-09-12 ENCOUNTER — Other Ambulatory Visit: Payer: Self-pay

## 2020-09-12 ENCOUNTER — Telehealth (HOSPITAL_COMMUNITY): Payer: Self-pay | Admitting: Licensed Clinical Social Worker

## 2020-09-12 ENCOUNTER — Ambulatory Visit (HOSPITAL_COMMUNITY)
Admission: EM | Admit: 2020-09-12 | Discharge: 2020-09-12 | Disposition: A | Discharge status: Home / Self Care | Payer: 59 | Attending: Registered Nurse | Admitting: Registered Nurse

## 2020-09-12 DIAGNOSIS — F1020 Alcohol dependence, uncomplicated: Secondary | ICD-10-CM | POA: Diagnosis not present

## 2020-09-12 DIAGNOSIS — F32A Anxiety disorder, unspecified: Secondary | ICD-10-CM | POA: Insufficient documentation

## 2020-09-12 DIAGNOSIS — F4323 Adjustment disorder with mixed anxiety and depressed mood: Secondary | ICD-10-CM | POA: Diagnosis not present

## 2020-09-12 DIAGNOSIS — F1094 Alcohol use, unspecified with alcohol-induced mood disorder: Secondary | ICD-10-CM | POA: Diagnosis present

## 2020-09-12 NOTE — ED Provider Notes (Signed)
Behavioral [facility] Urgent [facility] Medical Screening Exam  Patient Name: Rebekah Fischer MRN: [id number] Date of Evaluation: 09/12/20 Chief Complaint:   Diagnosis:  Final diagnoses:  Alcohol use disorder, severe, in controlled environment (HCC)  Alcohol-induced mood disorder with depressive symptoms (HCC)  Adjustment disorder with mixed anxiety and depressed mood    History of Present illness: Rebekah Fischer is a 65 y.o. female patient presented to [facility] as a walk in accompanied by her sister with complaints of alcohol abuse and depression seeking outpatient psychiatric services  Rebekah Fischer, 65 y.o., female patient seen face to face by this provider, consulted with Dr. Earlene Plater; and chart reviewed on 09/12/20.  On evaluation Rebekah Fischer reports recently she has been drinking everyday.  States "In one dream I died when I was at the behavioral hospital last year.  I was on the table and I couldn't say anything. I was screaming on the inside but no one could hear me.  Last night I dreamed that God told me needed to quit and then a baby was placed in my arms."  Patient states she wants to get help for her depression and alcohol use.  Patient lives alone but has the support of her sister, brother in law, and friends.  Patient reports one prior psychiatric admission "last May cause I was hearing voices."  Patient states she is treated for depression and anxiety by her primary care physician.  Denies prior suicide attempt.  Patient also denies suicidal/self-harm/homicidal ideation, psychosis, and paranoia. Patient sister is at her side an feels that patient is not a risk to herself or others and would be safe at home with outpatient psychiatric services.  Discussed PHP an CD IOP programs.  Patient states that she is interested.    During evaluation Rebekah Fischer is sitting up in chair in no acute distress.  She is alert, oriented x 4, calm and cooperative.  Her mood is dysphoric with congruent affect.  She does not  appear to be responding to internal/external stimuli or delusional thoughts.  Patient denies suicidal/self-harm/homicidal ideation, psychosis, and paranoia.  Patient answered question appropriately.     Psychiatric Specialty Exam  Presentation  General Appearance:Appropriate for Environment; Casual  Eye Contact:Good  Speech:Clear and Coherent; Normal Rate  Speech Volume:Normal  Handedness:Right   Mood and Affect  Mood:Dysphoric  Affect:Congruent; Tearful   Thought Process  Thought Processes:Coherent; Goal Directed  Descriptions of Associations:Intact  Orientation:Full (Time, Place and Person)  Thought Content:WDL; Logical    Hallucinations:None  Ideas of Reference:None  Suicidal Thoughts:No  Homicidal Thoughts:No   Sensorium  Memory:Immediate Good; Recent Good; Remote Good  Judgment:Intact  Insight:Present; Good   Executive Functions  Concentration:Good  Attention Span:Good  Recall:Good  Fund of Knowledge:Good  Language:Good   Psychomotor Activity  Psychomotor Activity:Normal   Assets  Assets:Communication Skills; Desire for Improvement; Financial Resources/Insurance; Housing; Resilience; Social Support; Transportation   Sleep  Sleep:Fair (Report she has been having bad, intrusive dreams that wake her)  Number of hours: No data recorded  Nutritional Assessment (For OBS and [facility] admissions only) Has the patient had a weight loss or gain of 10 pounds or more in the last 3 months?: No Has the patient had a decrease in food intake/or appetite?: No Does the patient have dental problems?: No Does the patient have eating habits or behaviors that may be indicators of an eating disorder including binging or inducing vomiting?: No Has the patient recently lost weight without trying?: No  Has the patient been eating poorly because of a decreased appetite?: No Malnutrition Screening Tool Score: 0    Physical Exam: Physical Exam Vitals and  nursing note reviewed. Exam conducted with a chaperone present.  Constitutional:      General: She is not in acute distress.    Appearance: Normal appearance. She is not ill-appearing.  HENT:     Head: Normocephalic.  Eyes:     Pupils: Pupils are equal, round, and reactive to light.  Cardiovascular:     Rate and Rhythm: Normal rate.     Comments: Hypertensive.  Patient informed to follow up with her PCP today (recent change in blood pressure medicine) If unable to see PCP go to ER. Pulmonary:     Effort: Pulmonary effort is normal.  Musculoskeletal:        General: Normal range of motion.     Cervical back: Normal range of motion.  Skin:    General: Skin is warm and dry.  Neurological:     General: No focal deficit present.     Mental Status: She is alert and oriented to person, place, and time.  Psychiatric:        Attention and Perception: Attention and perception normal. She does not perceive auditory or visual hallucinations.        Mood and Affect: Mood is anxious and depressed. Affect is tearful.        Speech: Speech normal.        Behavior: Behavior normal. Behavior is cooperative.        Thought Content: Thought content normal. Thought content is not paranoid or delusional. Thought content does not include homicidal or suicidal ideation.        Cognition and Memory: Cognition and memory normal.        Judgment: Judgment normal.    Review of Systems  Constitutional: Negative.   HENT: Negative.   Eyes: Negative.   Respiratory: Negative.   Cardiovascular: Negative for chest pain, palpitations, orthopnea and leg swelling.       History of hypertension and has been having trouble regulating blood pressure.  States PCP recently changed to a new medication.  States she took her blood pressure medicine around 4:30 or 5:00 this morning.  "I usually take with breakfast.  States that blood pressure "is probably up cause I'm upset to.  They can take it over since I'm calmer now."    Gastrointestinal: Negative.   Genitourinary: Negative.   Musculoskeletal: Negative.   Skin: Negative.   Neurological: Negative.  Negative for dizziness, tremors, seizures and headaches.  Endo/Heme/Allergies: Negative.   Psychiatric/Behavioral: Hallucinations: Denies. Memory loss: Denies. Substance abuse: Alcohol use daily.  Stopped one day ago.  Denies withdrawal symptoms.    Suicidal ideas: Denies. Nervous/anxious: Stable. Insomnia: States recently she has been having "intrusive bad dreams that wake me up."        Patient reporting she wants to get help for her depression and alcohol abuse.  States one leads to the other and she wants to stop    Blood pressure (!) 160/112, pulse 98, temperature 98.3 F (36.8 C), temperature source Oral, resp. rate 20, height 4\' 11"  (1.499 m), weight 125 lb (56.7 kg), SpO2 100 %. Body mass index is 25.25 kg/m.  Musculoskeletal: Strength & Muscle Tone: within normal limits Gait & Station: normal Patient leans: N/A   [facility] MSE Discharge Disposition for Follow up and Recommendations: Based on my evaluation the patient does not appear to have an  emergency medical condition and can be discharged with resources and follow up care in outpatient services for Medication Management, Partial Hospitalization Program, Substance Abuse Intensive Outpatient Program, Individual Therapy and Group Therapy    Follow-up Information    [facility].   Specialty: Behavioral Health Why: Referral sent in for outpatient therapy.  If you don't hear anything by 10:00 tomorrow call the above number to set up services Contact information: [address] [place] [address] [phone]              Assunta Found, NP 09/12/2020, 10:07 AM

## 2020-09-12 NOTE — BH Assessment (Signed)
Pt to [facility] voluntarily with chief complaint of depression and alcohol use. Pt reports going to [facility] May 2021 after having AH of a voices saying that she was going to die a horrible death. Pt reports "I died in triage" and reports she could hear everything that was going on. Pt denies inpt treatment at that time.  Pt reports she has been drinking daily since last May. Pt reports her last drink was on Tuesday. Pt also reports having a nightmare last night and reports hearing God tell her that today was the day she needed to get committed. Pt reports writing a six page letter to her sister about what she needed to do while she is in inpt treatment. Pt denies SI, HI, AVH. Pt is tearful during triage.   Pt is routine.

## 2020-09-17 ENCOUNTER — Other Ambulatory Visit: Payer: Self-pay

## 2020-09-17 ENCOUNTER — Encounter (HOSPITAL_COMMUNITY): Payer: 59

## 2020-09-17 ENCOUNTER — Ambulatory Visit (INDEPENDENT_AMBULATORY_CARE_PROVIDER_SITE_OTHER): Payer: 59 | Admitting: Licensed Clinical Social Worker

## 2020-09-17 DIAGNOSIS — F1010 Alcohol abuse, uncomplicated: Secondary | ICD-10-CM | POA: Diagnosis not present

## 2020-09-17 DIAGNOSIS — F1094 Alcohol use, unspecified with alcohol-induced mood disorder: Secondary | ICD-10-CM

## 2020-09-17 NOTE — Progress Notes (Deleted)
Quit drinking periodically for 3-4 months  HI first husband; dead PCP richard marrow moved to [place] lexapro 10mg  Most nihts 8-9 hours Lost 6 lbs in last week not drinking alcohol daily; lose bridge and less drinking Manager at like to sit and be busy Rumor had it father killed man twice; moved to [employer] a man to death Grandpa run white liquor mom 64 y/o legal Binge [age]; didn't drink for a number of years; drank with first husband miserable; 2nd husband only social drinking then binge b/c he wouldn't leave; daily After divorced back to socail Stopped a few times for 2-3 months ag a time  Daily after sisters died; after last may and holy water/oil (deamons long gone)  Client is a 65 y/o caucasian female presented for CDIOP referral following visit to [facility].
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
## 2020-09-23 ENCOUNTER — Encounter (HOSPITAL_COMMUNITY): Payer: 59

## 2020-09-25 ENCOUNTER — Encounter (HOSPITAL_COMMUNITY): Payer: 59

## 2020-09-26 ENCOUNTER — Telehealth (HOSPITAL_COMMUNITY): Payer: Self-pay | Admitting: Psychiatry

## 2020-09-26 ENCOUNTER — Encounter (HOSPITAL_COMMUNITY): Payer: 59

## 2020-10-02 ENCOUNTER — Encounter (HOSPITAL_COMMUNITY): Payer: 59

## 2020-10-03 ENCOUNTER — Encounter (HOSPITAL_COMMUNITY): Payer: 59

## 2020-10-07 ENCOUNTER — Encounter (HOSPITAL_COMMUNITY): Payer: 59

## 2020-10-09 ENCOUNTER — Encounter (HOSPITAL_COMMUNITY): Payer: 59

## 2020-10-10 ENCOUNTER — Encounter (HOSPITAL_COMMUNITY): Payer: 59

## 2020-10-14 ENCOUNTER — Encounter (HOSPITAL_COMMUNITY): Payer: 59

## 2020-10-16 ENCOUNTER — Encounter (HOSPITAL_COMMUNITY): Payer: 59

## 2020-10-17 ENCOUNTER — Encounter (HOSPITAL_COMMUNITY): Payer: 59

## 2020-10-21 ENCOUNTER — Encounter (HOSPITAL_COMMUNITY): Payer: 59

## 2020-10-23 ENCOUNTER — Encounter (HOSPITAL_COMMUNITY): Payer: 59

## 2020-10-24 ENCOUNTER — Encounter (HOSPITAL_COMMUNITY): Payer: 59

## 2020-10-28 ENCOUNTER — Encounter (HOSPITAL_COMMUNITY): Payer: 59

## 2020-10-30 ENCOUNTER — Encounter (HOSPITAL_COMMUNITY): Payer: 59

## 2020-10-31 ENCOUNTER — Encounter (HOSPITAL_COMMUNITY): Payer: 59

## 2020-11-06 ENCOUNTER — Encounter (HOSPITAL_COMMUNITY): Payer: 59

## 2020-11-07 ENCOUNTER — Encounter (HOSPITAL_COMMUNITY): Payer: 59

## 2020-11-25 ENCOUNTER — Ambulatory Visit (INDEPENDENT_AMBULATORY_CARE_PROVIDER_SITE_OTHER): Payer: 59 | Admitting: Registered Nurse

## 2020-11-25 ENCOUNTER — Other Ambulatory Visit: Payer: Self-pay

## 2020-11-25 ENCOUNTER — Encounter: Payer: Self-pay | Admitting: Registered Nurse

## 2020-11-25 VITALS — BP 157/71 | HR 76 | Temp 98.3°F | Resp 18 | Ht 59.0 in | Wt 129.0 lb

## 2020-11-25 DIAGNOSIS — Z13220 Encounter for screening for lipoid disorders: Secondary | ICD-10-CM

## 2020-11-25 DIAGNOSIS — I10 Essential (primary) hypertension: Secondary | ICD-10-CM

## 2020-11-25 DIAGNOSIS — W57XXXA Bitten or stung by nonvenomous insect and other nonvenomous arthropods, initial encounter: Secondary | ICD-10-CM

## 2020-11-25 DIAGNOSIS — Z1329 Encounter for screening for other suspected endocrine disorder: Secondary | ICD-10-CM

## 2020-11-25 DIAGNOSIS — M25531 Pain in right wrist: Secondary | ICD-10-CM | POA: Diagnosis not present

## 2020-11-25 DIAGNOSIS — M25532 Pain in left wrist: Secondary | ICD-10-CM

## 2020-11-25 DIAGNOSIS — Z13228 Encounter for screening for other metabolic disorders: Secondary | ICD-10-CM

## 2020-11-25 DIAGNOSIS — F32A Depression, unspecified: Secondary | ICD-10-CM

## 2020-11-25 DIAGNOSIS — J302 Other seasonal allergic rhinitis: Secondary | ICD-10-CM | POA: Diagnosis not present

## 2020-11-25 DIAGNOSIS — Z130 Encounter for screening for diseases of the blood and blood-forming organs and certain disorders involving the immune mechanism: Secondary | ICD-10-CM

## 2020-11-25 DIAGNOSIS — F419 Anxiety disorder, unspecified: Secondary | ICD-10-CM

## 2020-11-25 MED ORDER — ESCITALOPRAM OXALATE 10 MG PO TABS: ORAL_TABLET | ORAL | 0 refills | Status: DC

## 2020-11-25 MED ORDER — MONTELUKAST SODIUM 10 MG PO TABS: 10.0000 mg | ORAL_TABLET | Freq: Every day | ORAL | 3 refills | Status: DC

## 2020-11-25 MED ORDER — HYDROCORTISONE 1 % EX CREA: 1.0000 "application " | TOPICAL_CREAM | Freq: Two times a day (BID) | CUTANEOUS | 0 refills | Status: DC

## 2020-11-25 MED ORDER — LISINOPRIL-HYDROCHLOROTHIAZIDE 20-25 MG PO TABS: 1.0000 | ORAL_TABLET | Freq: Every day | ORAL | 3 refills | Status: DC

## 2020-11-25 MED ORDER — DICLOFENAC SODIUM 1 % EX GEL: 2.0000 g | Freq: Four times a day (QID) | CUTANEOUS | 1 refills | Status: DC

## 2020-11-25 MED ORDER — LEVOCETIRIZINE DIHYDROCHLORIDE 5 MG PO TABS: 5.0000 mg | ORAL_TABLET | Freq: Every evening | ORAL | 3 refills | Status: DC

## 2020-11-25 NOTE — Patient Instructions (Addendum)
Ms. [facility] to see you.  In brief:  Referral to hand specialist placed. They will call you. Ok to use topical diclofenac up to four times daily as needed for pain or numbness. Allergies: have sent xyzal. Have sent montelukast (singulair) for nightly use. May give bad dreams - if it does, stop taking it and let me know Mosquito bites: have sent hydrocortisone 1% cream. Use topically 1-2 times daily as needed. Anxiety: I am glad this is better. Let me know if it starts to get worse. Sending lexapro in case if does. Ok to start at 5mg  daily and increase to 10mg  daily if you need to. I have refilled bp meds Labs can be done here or at [place] at [facility] site - give [facility] a call if you'd like to have labs done there, we can get you the info on their location and open times. Let's plan to follow up in 6 months for blood pressure.  Thank you  [facility]     If you have lab work done today you will be contacted with your lab results within the next 2 weeks.  If you have not heard from [facility] then please contact us. The fastest way to get your results is to register for [facility].   IF you received an x-Loge today, you will receive an invoice from [facility]. Please contact [facility] at [phone] with questions or concerns regarding your invoice.   IF you received labwork today, you will receive an invoice from [facility]. Please contact [facility] at [phone] with questions or concerns regarding your invoice.   Our billing staff will not be able to assist you with questions regarding bills from these companies.  You will be contacted with the lab results as soon as they are available. The fastest way to get your results is to activate your [facility] account. Instructions are located on the last page of this paperwork. If you have not heard from [facility] regarding the results in 2 weeks, please contact this office.

## 2020-11-25 NOTE — Progress Notes (Signed)
Established Patient Office Visit  Subjective:  Patient ID: Rebekah Fischer, female    DOB: 31-Dec-1955  Age: 65 y.o. MRN: [id number]  CC:  Chief Complaint  Patient presents with   Referral    Patient states she will need an referral for carpal tunnel and need some medication refill.    HPI Sruthi E Dokes presents for a number of concerns:  Allergies: Has run out of xyzal Has intermittent cough, ongoing for years Worse now without xyzal Notes it has been persisting through xyzal for past few weeks regardless No sick contacts, no productive cough, no shob, doe, chest pain, or other symptoms.   Wrist pain: Bilateral, L>>>R, hx of carpal tunnel with relase on R side, has been told she would need L side done.  Numbness and pain in hand, wrist, and forearm Worse at night, worse when using during the day, worse when not wearing brace No acute injury or trauma  Anxiety Has stopped escitalopram Doing much better than she had in past 1-2 years No ongoing concerns Plans to restart if anxiety recurs.  Hypertension: Patient Currently taking: lisinopril-hctz 20-25mg  PO Qd Good effect. No AEs. Denies CV symptoms including: chest pain, shob, doe, headache, visual changes, fatigue, claudication, and dependent edema.   Previous readings and labs: BP Readings from Last 3 Encounters:  11/25/20 (!) 157/71  05/20/20 (!) 146/83  04/15/20 140/60   Lab Results  Component Value Date   CREATININE 0.79 03/19/2020   Mosquito bites: Occurred over preceding days Disseminated on exposed skin Red, itchy, mildly raised. No breaks in skin Has used OTC topical with some relief.  Interested in further relief.   Past Medical History:  Diagnosis Date   Allergy    Right atrial enlargement     Past Surgical History:  Procedure Laterality Date   CARPAL TUNNEL RELEASE     EYE SURGERY      Family History  Problem Relation Age of Onset   Heart failure Mother    Hypertension Mother     Hyperlipidemia Mother    Heart disease Mother    Emphysema Father    Hypertension Brother    Cancer Sister        "blood cancer"   Hypertension Sister    Hypertension Sister    Hypertension Sister    Hypertension Sister    Hypertension Sister    Hypertension Brother     Social History   Socioeconomic History   Marital status: Divorced    Spouse name: N/A   Number of children: 0   Years of education: 16   Highest education level: Not on file  Occupational History   Occupation: Licensed conveyancer  Tobacco Use   Smoking status: Former    Packs/day: 0.50    Types: Cigarettes    Start date: 08/11/1969    Quit date: 08/11/1984    Years since quitting: 36.3   Smokeless tobacco: Never  Vaping Use   Vaping Use: Never used  Substance and Sexual Activity   Alcohol use: Yes    Alcohol/week: 3.0 standard drinks    Types: 3 Standard drinks or equivalent per week   Drug use: No    Comment: previously used marijuana   Sexual activity: Never  Other Topics Concern   Not on file  Social History Narrative   Lives alone.   Social Determinants of Health   Financial Resource Strain: Not on file  Food Insecurity: Not on file  Transportation Needs: Not on file  Physical Activity: Not on file  Stress: Not on file  Social Connections: Not on file  Intimate Partner Violence: Not on file    Outpatient Medications Prior to Visit  Medication Sig Dispense Refill   escitalopram (LEXAPRO) 10 MG tablet TAKE 1 TABLET(10 MG) BY MOUTH DAILY 90 tablet 0   lisinopril-hydrochlorothiazide (ZESTORETIC) 20-25 MG tablet Take 1 tablet by mouth daily. 90 tablet 3   No facility-administered medications prior to visit.    Allergies  Allergen Reactions   Neomycin-Polymyxin B Gu     rash   Penicillins Rash    ROS Review of Systems  Constitutional: Negative.   HENT: Negative.    Eyes: Negative.   Respiratory:  Positive for cough. Negative for apnea, choking, chest tightness, shortness of breath,  wheezing and stridor.   Cardiovascular: Negative.   Gastrointestinal: Negative.   Endocrine: Negative.   Genitourinary: Negative.   Musculoskeletal:  Positive for arthralgias.  Skin: Negative.        Bug bites  Neurological: Negative.   Psychiatric/Behavioral: Negative.    All other systems reviewed and are negative.    Objective:    Physical Exam Vitals and nursing note reviewed.  Constitutional:      General: She is not in acute distress.    Appearance: Normal appearance. She is normal weight. She is not ill-appearing, toxic-appearing or diaphoretic.  Cardiovascular:     Rate and Rhythm: Normal rate and regular rhythm.     Heart sounds: Normal heart sounds. No murmur heard.   No friction rub. No gallop.  Pulmonary:     Effort: Pulmonary effort is normal. No respiratory distress.     Breath sounds: Normal breath sounds. No stridor. No wheezing, rhonchi or rales.  Chest:     Chest wall: No tenderness.  Musculoskeletal:     Comments: Positive Tinels left side  Skin:    General: Skin is warm and dry.     Comments: Bug bites across both arms and legs.   Neurological:     General: No focal deficit present.     Mental Status: She is alert and oriented to person, place, and time. Mental status is at baseline.  Psychiatric:        Mood and Affect: Mood normal.        Behavior: Behavior normal.        Thought Content: Thought content normal.        Judgment: Judgment normal.    BP (!) 157/71   Pulse 76   Temp 98.3 F (36.8 C) (Temporal)   Resp 18   Ht 4\' 11"  (1.499 m)   Wt 129 lb (58.5 kg)   SpO2 97%   BMI 26.05 kg/m  Wt Readings from Last 3 Encounters:  11/25/20 129 lb (58.5 kg)  03/19/20 126 lb (57.2 kg)  03/15/20 122 lb (55.3 kg)     There are no preventive care reminders to display for this patient.  There are no preventive care reminders to display for this patient.  Lab Results  Component Value Date   TSH 1.949 09/12/2019   Lab Results  Component  Value Date   WBC 7.1 03/19/2020   HGB 12.7 03/19/2020   HCT 37.7 03/19/2020   MCV 94 03/19/2020   PLT 302 03/19/2020   Lab Results  Component Value Date   NA 132 (L) 03/19/2020   K 4.2 03/19/2020   CO2 26 03/19/2020   GLUCOSE 97 03/19/2020   BUN 8 03/19/2020   CREATININE  0.79 03/19/2020   BILITOT 1.0 09/12/2019   ALKPHOS 86 09/12/2019   AST 21 09/12/2019   ALT 15 09/12/2019   PROT 8.3 (H) 09/12/2019   ALBUMIN 5.0 09/12/2019   CALCIUM 9.6 03/19/2020   ANIONGAP 14 03/15/2020   Lab Results  Component Value Date   CHOL 183 10/03/2019   Lab Results  Component Value Date   HDL 73 10/03/2019   Lab Results  Component Value Date   LDLCALC 88 10/03/2019   Lab Results  Component Value Date   TRIG 130 10/03/2019   Lab Results  Component Value Date   CHOLHDL 2.5 10/03/2019   No results found for: HGBA1C    Assessment & Plan:   Problem List Items Addressed This Visit   None Visit Diagnoses     Bilateral wrist pain    -  Primary   Relevant Medications   diclofenac Sodium (VOLTAREN) 1 % GEL   Other Relevant Orders   Ambulatory referral to Hand Surgery   Essential hypertension       Relevant Medications   lisinopril-hydrochlorothiazide (ZESTORETIC) 20-25 MG tablet   Anxiety and depression       Relevant Medications   escitalopram (LEXAPRO) 10 MG tablet   Seasonal allergies       Relevant Medications   levocetirizine (XYZAL) 5 MG tablet   montelukast (SINGULAIR) 10 MG tablet   Screening for endocrine, metabolic and immunity disorder       Relevant Orders   Comprehensive metabolic panel   Hemoglobin A1c   CBC with Differential/Platelet   TSH   Lipid screening       Relevant Orders   Lipid panel   Mosquito bite, initial encounter       Relevant Medications   hydrocortisone cream 1 %       Meds ordered this encounter  Medications   lisinopril-hydrochlorothiazide (ZESTORETIC) 20-25 MG tablet    Sig: Take 1 tablet by mouth daily.    Dispense:  90  tablet    Refill:  3    Order Specific Question:   Supervising Provider    Answer:   Neva Seat, JEFFREY R [[id number]]   escitalopram (LEXAPRO) 10 MG tablet    Sig: TAKE 1 TABLET(10 MG) BY MOUTH DAILY    Dispense:  90 tablet    Refill:  0    Order Specific Question:   Supervising Provider    Answer:   Neva Seat, JEFFREY R [[id number]]   levocetirizine (XYZAL) 5 MG tablet    Sig: Take 1 tablet (5 mg total) by mouth every evening.    Dispense:  90 tablet    Refill:  3    Order Specific Question:   Supervising Provider    Answer:   Neva Seat, JEFFREY R [[id number]]   montelukast (SINGULAIR) 10 MG tablet    Sig: Take 1 tablet (10 mg total) by mouth at bedtime.    Dispense:  30 tablet    Refill:  3    Order Specific Question:   Supervising Provider    Answer:   Neva Seat, JEFFREY R [[id number]]   hydrocortisone cream 1 %    Sig: Apply 1 application topically 2 (two) times daily.    Dispense:  30 g    Refill:  0    Order Specific Question:   Supervising Provider    Answer:   Neva Seat, JEFFREY R [[id number]]   diclofenac Sodium (VOLTAREN) 1 % GEL    Sig: Apply 2 g topically 4 (four) times daily.  Dispense:  50 g    Refill:  1    Order Specific Question:   Supervising Provider    Answer:   Neva Seat, JEFFREY R [[id number]]    Follow-up: Return in about 6 months (around 05/28/2021) for HTN.   PLAN Bug bites: apply cortisone cream. Reviewed preventative care. Return if worsening or failing to improve. Allergies: refill xyzal, start singulair before bed. Can consider allergy referral if persistent.  Wrist pain: refer to hand surgery, will almost certainly need carpal tunnel release. Can use diclofenac topical in interim for relief.  BP: elevated today. Will recheck in coming weeks. Return in 6 mo Labs to be drawn here or at [place] office in coming 2-3 weeks. Will follow up as warranted. Patient encouraged to call clinic with any questions, comments, or concerns.  I spent 41 minutes with this patient coordinating referral, managing  medications including 2 new prescriptions, and addressing mental health.  Janeece Agee, NP

## 2020-12-12 ENCOUNTER — Other Ambulatory Visit: Payer: Self-pay

## 2020-12-12 ENCOUNTER — Ambulatory Visit (INDEPENDENT_AMBULATORY_CARE_PROVIDER_SITE_OTHER): Payer: 59 | Admitting: Orthopaedic Surgery

## 2020-12-12 ENCOUNTER — Encounter: Payer: Self-pay | Admitting: Orthopaedic Surgery

## 2020-12-12 VITALS — Ht 59.0 in | Wt 129.0 lb

## 2020-12-12 DIAGNOSIS — G5602 Carpal tunnel syndrome, left upper limb: Secondary | ICD-10-CM | POA: Diagnosis not present

## 2020-12-12 NOTE — Progress Notes (Signed)
Office Visit Note   Patient: Rebekah Fischer           Date of Birth: 28-Dec-1955           MRN: [id number] Visit Date: 12/12/2020              Requested by: Janeece Agee, NP [id number] [place],  [place] [address] PCP: Janeece Agee, NP   Assessment & Plan: Visit Diagnoses:  1. Left carpal tunnel syndrome     Plan: Impression is left carpal tunnel syndrome.  After discussion of treatment options to include continued braces, injection, surgical release patient has elected for a carpal tunnel release in the near future.  Risk benefits rehab recovery alternatives to surgery all reviewed with the patient in detail.  Questions encouraged and answered.  Follow-Up Instructions: Return for Postop.   Orders:  No orders of the defined types were placed in this encounter.  No orders of the defined types were placed in this encounter.     Procedures: No procedures performed   Clinical Data: No additional findings.   Subjective: Chief Complaint  Patient presents with   Left Wrist - Pain, Numbness    Rebekah Fischer is a 65 year old female here for evaluation of worsening left wrist and hand pain with numbness in the hand and arm.  She had carpal tunnel release on her right hand about 25 years ago and her symptoms in the left hand are almost identical to what she experienced in the right hand.  She has worn carpal tunnel braces at night for years and she previously had a cortisone injection for the right hand but did not help and caused a lot of pain she is not interested in undergoing an injection for the left hand.  She wakes up with numbness and pain in her hand.  She often has to work with entirely numb hand.  She is gotten to the point where she can no longer tolerate the symptoms.   Review of Systems  Constitutional: Negative.   HENT: Negative.    Eyes: Negative.   Respiratory: Negative.    Cardiovascular: Negative.   Endocrine: Negative.   Musculoskeletal: Negative.    Neurological: Negative.   Hematological: Negative.   Psychiatric/Behavioral: Negative.    All other systems reviewed and are negative.   Objective: Vital Signs: Ht 4\' 11"  (1.499 m)   Wt 129 lb (58.5 kg)   BMI 26.05 kg/m   Physical Exam Vitals and nursing note reviewed.  Constitutional:      Appearance: She is well-developed.  HENT:     Head: Normocephalic and atraumatic.  Pulmonary:     Effort: Pulmonary effort is normal.  Abdominal:     Palpations: Abdomen is soft.  Musculoskeletal:     Cervical back: Neck supple.  Skin:    General: Skin is warm.     Capillary Refill: Capillary refill takes less than 2 seconds.  Neurological:     Mental Status: She is alert and oriented to person, place, and time.  Psychiatric:        Behavior: Behavior normal.        Thought Content: Thought content normal.        Judgment: Judgment normal.    Ortho Exam Left hand show some mild thenar flattening.  Decreased sensation to the index long and ring fingertips.  No neurovascular compromise.  Positive carpal tunnel compressive signs. Specialty Comments:  No specialty comments available.  Imaging: No results found.  PMFS History: Patient Active Problem List   Diagnosis Date Noted   Alcohol use disorder, severe, in controlled environment (HCC) 09/12/2020   Alcohol-induced mood disorder with depressive symptoms (HCC) 09/12/2020   Adjustment disorder with mixed anxiety and depressed mood 09/13/2019   Past Medical History:  Diagnosis Date   Allergy    Right atrial enlargement     Family History  Problem Relation Age of Onset   Heart failure Mother    Hypertension Mother    Hyperlipidemia Mother    Heart disease Mother    Emphysema Father    Hypertension Brother    Cancer Sister        "blood cancer"   Hypertension Sister    Hypertension Sister    Hypertension Sister    Hypertension Sister    Hypertension Sister    Hypertension Brother     Past Surgical History:   Procedure Laterality Date   CARPAL TUNNEL RELEASE     EYE SURGERY     Social History   Occupational History   Occupation: Licensed conveyancer  Tobacco Use   Smoking status: Former    Packs/day: 0.50    Types: Cigarettes    Start date: 08/11/1969    Quit date: 08/11/1984    Years since quitting: 36.3   Smokeless tobacco: Never  Vaping Use   Vaping Use: Never used  Substance and Sexual Activity   Alcohol use: Yes    Alcohol/week: 3.0 standard drinks    Types: 3 Standard drinks or equivalent per week   Drug use: No    Comment: previously used marijuana   Sexual activity: Never

## 2020-12-25 ENCOUNTER — Other Ambulatory Visit: Payer: Self-pay

## 2020-12-25 ENCOUNTER — Encounter (HOSPITAL_BASED_OUTPATIENT_CLINIC_OR_DEPARTMENT_OTHER): Payer: Self-pay | Admitting: Orthopaedic Surgery

## 2020-12-27 ENCOUNTER — Encounter (HOSPITAL_BASED_OUTPATIENT_CLINIC_OR_DEPARTMENT_OTHER)
Admission: RE | Admit: 2020-12-27 | Discharge: 2020-12-27 | Disposition: A | Discharge status: Home / Self Care | Payer: 59 | Source: Ambulatory Visit | Attending: Orthopaedic Surgery | Admitting: Orthopaedic Surgery

## 2020-12-27 DIAGNOSIS — Z01812 Encounter for preprocedural laboratory examination: Secondary | ICD-10-CM | POA: Insufficient documentation

## 2020-12-27 LAB — BASIC METABOLIC PANEL
Anion gap: 11 (ref 5–15)
BUN: 32 mg/dL — ABNORMAL HIGH (ref 8–23)
CO2: 25 mmol/L (ref 22–32)
Calcium: 8.9 mg/dL (ref 8.9–10.3)
Chloride: 94 mmol/L — ABNORMAL LOW (ref 98–111)
Creatinine, Ser: 0.83 mg/dL (ref 0.44–1.00)
GFR, Estimated: >60 mL/min (ref >60)
Glucose, Bld: 99 mg/dL (ref 70–99)
Potassium: 4.1 mmol/L (ref 3.5–5.1)
Sodium: 130 mmol/L — ABNORMAL LOW (ref 135–145)

## 2020-12-27 NOTE — Progress Notes (Signed)

## 2021-01-01 ENCOUNTER — Other Ambulatory Visit: Payer: Self-pay

## 2021-01-01 ENCOUNTER — Ambulatory Visit (HOSPITAL_BASED_OUTPATIENT_CLINIC_OR_DEPARTMENT_OTHER): Payer: 59 | Admitting: Certified Registered"

## 2021-01-01 ENCOUNTER — Ambulatory Visit (HOSPITAL_BASED_OUTPATIENT_CLINIC_OR_DEPARTMENT_OTHER)
Admission: RE | Admit: 2021-01-01 | Discharge: 2021-01-01 | Disposition: A | Discharge status: Home / Self Care | Payer: 59 | Attending: Orthopaedic Surgery | Admitting: Orthopaedic Surgery

## 2021-01-01 ENCOUNTER — Encounter (HOSPITAL_BASED_OUTPATIENT_CLINIC_OR_DEPARTMENT_OTHER): Payer: Self-pay | Admitting: Orthopaedic Surgery

## 2021-01-01 ENCOUNTER — Encounter (HOSPITAL_BASED_OUTPATIENT_CLINIC_OR_DEPARTMENT_OTHER)
Admission: RE | Disposition: A | Discharge status: Home / Self Care | Payer: Self-pay | Source: Home / Self Care | Attending: Orthopaedic Surgery

## 2021-01-01 DIAGNOSIS — G5602 Carpal tunnel syndrome, left upper limb: Secondary | ICD-10-CM

## 2021-01-01 DIAGNOSIS — Z880 Allergy status to penicillin: Secondary | ICD-10-CM | POA: Insufficient documentation

## 2021-01-01 DIAGNOSIS — Z79899 Other long term (current) drug therapy: Secondary | ICD-10-CM | POA: Insufficient documentation

## 2021-01-01 DIAGNOSIS — Z87891 Personal history of nicotine dependence: Secondary | ICD-10-CM | POA: Insufficient documentation

## 2021-01-01 DIAGNOSIS — Z881 Allergy status to other antibiotic agents status: Secondary | ICD-10-CM | POA: Insufficient documentation

## 2021-01-01 HISTORY — DX: Essential (primary) hypertension: I10

## 2021-01-01 HISTORY — PX: CARPAL TUNNEL RELEASE: SHX101

## 2021-01-01 SURGERY — CARPAL TUNNEL RELEASE
Anesthesia: Monitor Anesthesia Care | Site: Hand | Laterality: Left

## 2021-01-01 MED ORDER — MIDAZOLAM HCL 5 MG/5ML IJ SOLN
INTRAMUSCULAR | Status: DC | PRN
  Administered 2021-01-01: 2 mg via INTRAVENOUS

## 2021-01-01 MED ORDER — FENTANYL CITRATE (PF) 100 MCG/2ML IJ SOLN
INTRAMUSCULAR | Status: AC
  Filled 2021-01-01: qty 2

## 2021-01-01 MED ORDER — OXYCODONE HCL 5 MG PO TABS: 5.0000 mg | ORAL_TABLET | Freq: Once | ORAL | Status: DC | PRN

## 2021-01-01 MED ORDER — MIDAZOLAM HCL 2 MG/2ML IJ SOLN
INTRAMUSCULAR | Status: AC
  Filled 2021-01-01: qty 2

## 2021-01-01 MED ORDER — ONDANSETRON HCL 4 MG/2ML IJ SOLN
INTRAMUSCULAR | Status: DC | PRN
  Administered 2021-01-01: 4 mg via INTRAVENOUS

## 2021-01-01 MED ORDER — LIDOCAINE HCL (PF) 2 % IJ SOLN
INTRAMUSCULAR | Status: AC
  Filled 2021-01-01: qty 5

## 2021-01-01 MED ORDER — FENTANYL CITRATE (PF) 100 MCG/2ML IJ SOLN
INTRAMUSCULAR | Status: DC | PRN
  Administered 2021-01-01: 100 ug via INTRAVENOUS

## 2021-01-01 MED ORDER — PROPOFOL 10 MG/ML IV BOLUS
INTRAVENOUS | Status: DC | PRN
  Administered 2021-01-01: 10 mg via INTRAVENOUS
  Administered 2021-01-01: 20 mg via INTRAVENOUS
  Administered 2021-01-01 (×4): 10 mg via INTRAVENOUS
  Administered 2021-01-01: 50 mg via INTRAVENOUS

## 2021-01-01 MED ORDER — CEFAZOLIN SODIUM-DEXTROSE 2-4 GM/100ML-% IV SOLN
INTRAVENOUS | Status: AC
  Filled 2021-01-01: qty 100

## 2021-01-01 MED ORDER — 0.9 % SODIUM CHLORIDE (POUR BTL) OPTIME
TOPICAL | Status: DC | PRN
  Administered 2021-01-01: 200 mL

## 2021-01-01 MED ORDER — PROPOFOL 500 MG/50ML IV EMUL
INTRAVENOUS | Status: AC
  Filled 2021-01-01: qty 50

## 2021-01-01 MED ORDER — HYDROCODONE-ACETAMINOPHEN 5-325 MG PO TABS: 1.0000 | ORAL_TABLET | Freq: Four times a day (QID) | ORAL | 0 refills | Status: DC | PRN

## 2021-01-01 MED ORDER — CEFAZOLIN SODIUM-DEXTROSE 2-4 GM/100ML-% IV SOLN
2.0000 g | INTRAVENOUS | Status: AC
  Administered 2021-01-01: 2 g via INTRAVENOUS

## 2021-01-01 MED ORDER — LIDOCAINE HCL (CARDIAC) PF 100 MG/5ML IV SOSY
PREFILLED_SYRINGE | INTRAVENOUS | Status: DC | PRN
  Administered 2021-01-01: 40 mg via INTRAVENOUS

## 2021-01-01 MED ORDER — OXYCODONE HCL 5 MG/5ML PO SOLN: 5.0000 mg | Freq: Once | ORAL | Status: DC | PRN

## 2021-01-01 MED ORDER — BUPIVACAINE HCL (PF) 0.25 % IJ SOLN
INTRAMUSCULAR | Status: DC | PRN
  Administered 2021-01-01: 3 mL

## 2021-01-01 MED ORDER — ONDANSETRON HCL 4 MG/2ML IJ SOLN
INTRAMUSCULAR | Status: AC
  Filled 2021-01-01: qty 2

## 2021-01-01 MED ORDER — LACTATED RINGERS IV SOLN: INTRAVENOUS | Status: DC

## 2021-01-01 MED ORDER — LIDOCAINE-EPINEPHRINE 1 %-1:100000 IJ SOLN
INTRAMUSCULAR | Status: DC | PRN
Start: 2021-01-01 — End: 2021-01-01
  Administered 2021-01-01: 3 mL

## 2021-01-01 MED ORDER — PROMETHAZINE HCL 25 MG/ML IJ SOLN: 6.2500 mg | INTRAMUSCULAR | Status: DC | PRN

## 2021-01-01 MED ORDER — PROPOFOL 10 MG/ML IV BOLUS
INTRAVENOUS | Status: AC
  Filled 2021-01-01: qty 20

## 2021-01-01 MED ORDER — LIDOCAINE-EPINEPHRINE 1 %-1:100000 IJ SOLN
INTRAMUSCULAR | Status: AC
  Filled 2021-01-01: qty 1

## 2021-01-01 MED ORDER — HYDROMORPHONE HCL 1 MG/ML IJ SOLN: 0.2500 mg | INTRAMUSCULAR | Status: DC | PRN

## 2021-01-01 SURGICAL SUPPLY — 47 items
BAND INSRT 18 STRL LF DISP RB (MISCELLANEOUS) ×2
BAND RUBBER #18 3X1/16 STRL (MISCELLANEOUS) ×4 IMPLANT
BLADE MINI RND TIP GREEN BEAV (BLADE) ×2 IMPLANT
BLADE SURG 15 STRL LF DISP TIS (BLADE) ×1 IMPLANT
BLADE SURG 15 STRL SS (BLADE) ×2
BNDG CMPR 9X4 STRL LF SNTH (GAUZE/BANDAGES/DRESSINGS) ×1
BNDG ELASTIC 3X5.8 VLCR STR LF (GAUZE/BANDAGES/DRESSINGS) ×2 IMPLANT
BNDG ESMARK 4X9 LF (GAUZE/BANDAGES/DRESSINGS) ×2 IMPLANT
BNDG PLASTER X FAST 3X3 WHT LF (CAST SUPPLIES) IMPLANT
BNDG PLSTR 9X3 FST ST WHT (CAST SUPPLIES)
BRUSH SCRUB EZ PLAIN DRY (MISCELLANEOUS) ×2 IMPLANT
CANISTER SUCT 1200ML W/VALVE (MISCELLANEOUS) ×2 IMPLANT
CORD BIPOLAR FORCEPS 12FT (ELECTRODE) ×2 IMPLANT
COVER BACK TABLE 60X90IN (DRAPES) ×2 IMPLANT
COVER MAYO STAND STRL (DRAPES) ×2 IMPLANT
CUFF TOURN SGL QUICK 18X4 (TOURNIQUET CUFF) IMPLANT
DECANTER SPIKE VIAL GLASS SM (MISCELLANEOUS) IMPLANT
DRAPE EXTREMITY T 121X128X90 (DISPOSABLE) ×2 IMPLANT
DRAPE IMP U-DRAPE 54X76 (DRAPES) ×2 IMPLANT
DRAPE SURG 17X23 STRL (DRAPES) ×2 IMPLANT
GAUZE 4X4 16PLY ~~LOC~~+RFID DBL (SPONGE) IMPLANT
GAUZE SPONGE 4X4 12PLY STRL (GAUZE/BANDAGES/DRESSINGS) ×2 IMPLANT
GAUZE XEROFORM 1X8 LF (GAUZE/BANDAGES/DRESSINGS) ×2 IMPLANT
GLOVE SURG NEOP MICRO LF SZ7.5 (GLOVE) ×2 IMPLANT
GLOVE SURG SYN 7.5  E (GLOVE) ×2
GLOVE SURG SYN 7.5 E (GLOVE) ×1 IMPLANT
GLOVE SURG SYN 7.5 PF PI (GLOVE) ×1 IMPLANT
GLOVE SURG UNDER POLY LF SZ7 (GLOVE) ×2 IMPLANT
GLOVE SURG UNDER POLY LF SZ7.5 (GLOVE) ×2 IMPLANT
GOWN STRL REIN XL XLG (GOWN DISPOSABLE) ×4 IMPLANT
GOWN STRL REUS W/ TWL LRG LVL3 (GOWN DISPOSABLE) ×1 IMPLANT
GOWN STRL REUS W/TWL LRG LVL3 (GOWN DISPOSABLE) ×2
NDL HYPO 25X1 1.5 SAFETY (NEEDLE) IMPLANT
NEEDLE HYPO 25X1 1.5 SAFETY (NEEDLE) IMPLANT
NS IRRIG 1000ML POUR BTL (IV SOLUTION) ×2 IMPLANT
PACK BASIN DAY SURGERY FS (CUSTOM PROCEDURE TRAY) ×2 IMPLANT
PAD CAST 3X4 CTTN HI CHSV (CAST SUPPLIES) ×1 IMPLANT
PADDING CAST COTTON 3X4 STRL (CAST SUPPLIES) ×2
SHEET MEDIUM DRAPE 40X70 STRL (DRAPES) ×2 IMPLANT
STOCKINETTE 4X48 STRL (DRAPES) ×2 IMPLANT
SUT ETHILON 3 0 PS 1 (SUTURE) ×2 IMPLANT
SYR BULB EAR ULCER 3OZ GRN STR (SYRINGE) ×2 IMPLANT
SYR CONTROL 10ML LL (SYRINGE) IMPLANT
TOWEL GREEN STERILE FF (TOWEL DISPOSABLE) ×2 IMPLANT
TRAY DSU PREP LF (CUSTOM PROCEDURE TRAY) ×2 IMPLANT
TUBE CONNECTING 20X1/4 (TUBING) IMPLANT
UNDERPAD 30X36 HEAVY ABSORB (UNDERPADS AND DIAPERS) ×2 IMPLANT

## 2021-01-01 NOTE — Discharge Instructions (Addendum)
Postoperative instructions:  Weightbearing instructions: no lifting more than 10 lbs for 4 weeks  Keep your dressing and/or splint clean and dry at all times.  You can remove your dressing on post-operative day #3 and change with a dry/sterile dressing or Band-Aids as needed thereafter.    Incision instructions:  Do not soak your incision for 3 weeks after surgery.  If the incision gets wet, pat dry and do not scrub the incision.  Pain control:  You have been given a prescription to be taken as directed for post-operative pain control.  In addition, elevate the operative extremity above the heart at all times to prevent swelling and throbbing pain.  Take over-the-counter Colace, 100mg  by mouth twice a day while taking narcotic pain medications to help prevent constipation.  Follow up appointments: 1) 7 days for suture removal and wound check. 2) Dr. as scheduled.   -------------------------------------------------------------------------------------------------------------  After Surgery Pain Control:  After your surgery, post-surgical discomfort or pain is likely. This discomfort can last several days to a few weeks. At certain times of the day your discomfort may be more intense.  Did you receive a nerve block?  A nerve block can provide pain relief for one hour to two days after your surgery. As long as the nerve block is working, you will experience little or no sensation in the area the surgeon operated on.  As the nerve block wears off, you will begin to experience pain or discomfort. It is very important that you begin taking your prescribed pain medication before the nerve block fully wears off. Treating your pain at the first sign of the block wearing off will ensure your pain is better controlled and more tolerable when full-sensation returns. Do not wait until the pain is intolerable, as the medicine will be less effective. It is better to treat pain in advance than to try  and catch up.  General Anesthesia:  If you did not receive a nerve block during your surgery, you will need to start taking your pain medication shortly after your surgery and should continue to do so as prescribed by your surgeon.  Pain Medication:  Most commonly we prescribe Vicodin and Percocet for post-operative pain. Both of these medications contain a combination of acetaminophen (Tylenol) and a narcotic to help control pain.   It takes between 30 and 45 minutes before pain medication starts to work. It is important to take your medication before your pain level gets too intense.   Nausea is a common side effect of many pain medications. You will want to eat something before taking your pain medicine to help prevent nausea.   If you are taking a prescription pain medication that contains acetaminophen, we recommend that you do not take additional over the counter acetaminophen (Tylenol).  Other pain relieving options:   Using a cold pack to ice the affected area a few times a day (15 to 20 minutes at a time) can help to relieve pain, reduce swelling and bruising.   Elevation of the affected area can also help to reduce pain and swelling.      Post Anesthesia Home Care Instructions  Activity: Get plenty of rest for the remainder of the day. A responsible individual must stay with you for 24 hours following the procedure.  For the next 24 hours, DO NOT: -Drive a car -Roda Shutters -Drink alcoholic beverages -Take any medication unless instructed by your physician -Make any legal decisions or sign important papers.  Meals: Start with liquid foods such as gelatin or soup. Progress to regular foods as tolerated. Avoid greasy, spicy, heavy foods. If nausea and/or vomiting occur, drink only clear liquids until the nausea and/or vomiting subsides. Call your physician if vomiting continues.  Special Instructions/Symptoms: Your throat may feel dry or sore from the anesthesia or  the breathing tube placed in your throat during surgery. If this causes discomfort, gargle with warm salt water. The discomfort should disappear within 24 hours.  If you had a scopolamine patch placed behind your ear for the management of post- operative nausea and/or vomiting:  1. The medication in the patch is effective for 72 hours, after which it should be removed.  Wrap patch in a tissue and discard in the trash. Wash hands thoroughly with soap and water. 2. You may remove the patch earlier than 72 hours if you experience unpleasant side effects which may include dry mouth, dizziness or visual disturbances. 3. Avoid touching the patch. Wash your hands with soap and water after contact with the patch.

## 2021-01-01 NOTE — Anesthesia Preprocedure Evaluation (Signed)
Anesthesia Evaluation  Patient identified by MRN, date of birth, ID band Patient awake    Reviewed: Allergy & Precautions, NPO status , Patient's Chart, lab work & pertinent test results  Airway Mallampati: II  TM Distance: >3 FB Neck ROM: Full    Dental no notable dental hx.    Pulmonary neg pulmonary ROS, former smoker,    Pulmonary exam normal breath sounds clear to auscultation       Cardiovascular hypertension, Pt. on medications negative cardio ROS Normal cardiovascular exam Rhythm:Regular Rate:Normal     Neuro/Psych Depression negative neurological ROS  negative psych ROS   GI/Hepatic negative GI ROS, Neg liver ROS,   Endo/Other  negative endocrine ROS  Renal/GU negative Renal ROS  negative genitourinary   Musculoskeletal negative musculoskeletal ROS (+)   Abdominal   Peds negative pediatric ROS (+)  Hematology negative hematology ROS (+)   Anesthesia Other Findings   Reproductive/Obstetrics negative OB ROS                             Anesthesia Physical Anesthesia Plan  ASA: 2  Anesthesia Plan: MAC   Post-op Pain Management:    Induction: Intravenous  PONV Risk Score and Plan: 2 and Ondansetron, Midazolam and Treatment may vary due to age or medical condition  Airway Management Planned: Simple Face Mask  Additional Equipment:   Intra-op Plan:   Post-operative Plan:   Informed Consent: I have reviewed the patients History and Physical, chart, labs and discussed the procedure including the risks, benefits and alternatives for the proposed anesthesia with the patient or authorized representative who has indicated his/her understanding and acceptance.     Dental advisory given  Plan Discussed with: CRNA  Anesthesia Plan Comments:         Anesthesia Quick Evaluation

## 2021-01-01 NOTE — Anesthesia Postprocedure Evaluation (Signed)
Anesthesia Post Note  Patient: Rebekah Fischer  Procedure(s) Performed: LEFT CARPAL TUNNEL RELEASE (Left: Hand)     Patient location during evaluation: Phase II Anesthesia Type: MAC Level of consciousness: awake Pain management: pain level controlled Vital Signs Assessment: post-procedure vital signs reviewed and stable Respiratory status: spontaneous breathing Cardiovascular status: stable Postop Assessment: no apparent nausea or vomiting Anesthetic complications: no   No notable events documented.  Last Vitals:  Vitals:   01/01/21 1330 01/01/21 1345  BP: (!) 149/73 (!) 154/66  Pulse: 70 73  Resp: 13 15  Temp:    SpO2: 97% 98%    Last Pain:  Vitals:   01/01/21 1316  TempSrc:   PainSc: 0-No pain                 Huston Foley

## 2021-01-01 NOTE — Op Note (Signed)
   Carpal tunnel op note  DATE OF SURGERY:01/01/2021  PREOPERATIVE DIAGNOSIS:  Left carpal tunnel syndrome  POSTOPERATIVE DIAGNOSIS: same  PROCEDURE: Left carpal tunnel release. CPT 75436  SURGEON: Marianna Payment, M.D.  ASSIST: Madalyn Rob, [place]  ANESTHESIA:  Local and MAC  TOURNIQUET TIME: less than 20 minutes  BLOOD LOSS: Minimal.  COMPLICATIONS: None.  PATHOLOGY: None.  INDICATIONS: The patient is a 65 y.o. -year-old female who presented with carpal tunnel syndrome failing nonsurgical management, indicated for surgical release.  DESCRIPTION OF PROCEDURE: The patient was identified in the preoperative holding area.  The operative site was marked by the surgeon and confirmed by the patient.  The patient was brought back to the operating room.  MAC anesthesia was administered.  Local anesthetic with epi was injected into the operative site.  A well padded nonsterile tourniquet was placed. The operative extremity was prepped and draped in standard sterile fashion.  A timeout was performed.  Preoperative antibiotics were given.   A palmar incision was made about 5 mm ulnar to the thenar crease.  The palmar aponeurosis was exposed and divided in line with the skin incision. The palmaris brevis was visualized and divided.  The distal edge of the transcarpal ligament was identified. A hemostat was inserted into the carpal tunnel to protect the median nerve and the flexor tendons. Then, the transverse carpal ligament was released under direct visualization. Proximally, a subcutaneous tunnel was made allowing a Sewell retractor to be placed. Then, the distal portion of the antebrachial fascia was released. Distally, all fibrous bands were released. The median nerve with the recurrent branch was visualized. Wound was irrigated and closed with 4-0 nylon sutures. Sterile dressing applied. The patient was transferred to the recovery room in stable condition after all counts were  correct.  POSTOPERATIVE PLAN: To start nerve gliding exercises as tolerated and no heavy lifting for four weeks.  Eduard Roux, M.D. [facility] 1:05 PM

## 2021-01-01 NOTE — H&P (Signed)
PREOPERATIVE H&P  Chief Complaint: left carpal tunnel syndrome  HPI: Rebekah Fischer is a 65 y.o. female who presents for surgical treatment of left carpal tunnel syndrome.  She denies any changes in medical history.  Past Medical History:  Diagnosis Date   Allergy    Hypertension    Right atrial enlargement    Past Surgical History:  Procedure Laterality Date   CARPAL TUNNEL RELEASE     EYE SURGERY     Social History   Socioeconomic History   Marital status: Divorced    Spouse name: N/A   Number of children: 0   Years of education: 16   Highest education level: Not on file  Occupational History   Occupation: Licensed conveyancer  Tobacco Use   Smoking status: Former    Packs/day: 0.50    Types: Cigarettes    Start date: 08/11/1969    Quit date: 08/11/1984    Years since quitting: 36.4   Smokeless tobacco: Never  Vaping Use   Vaping Use: Never used  Substance and Sexual Activity   Alcohol use: Yes    Alcohol/week: 3.0 standard drinks    Types: 3 Standard drinks or equivalent per week   Drug use: No    Comment: previously used marijuana   Sexual activity: Never  Other Topics Concern   Not on file  Social History Narrative   Lives alone.   Social Determinants of Health   Financial Resource Strain: Not on file  Food Insecurity: Not on file  Transportation Needs: Not on file  Physical Activity: Not on file  Stress: Not on file  Social Connections: Not on file   Family History  Problem Relation Age of Onset   Heart failure Mother    Hypertension Mother    Hyperlipidemia Mother    Heart disease Mother    Emphysema Father    Hypertension Brother    Cancer Sister        "blood cancer"   Hypertension Sister    Hypertension Sister    Hypertension Sister    Hypertension Sister    Hypertension Sister    Hypertension Brother    Allergies  Allergen Reactions   Neomycin-Polymyxin B Gu     rash   Penicillins Rash   Prior to Admission medications    Medication Sig Start Date End Date Taking? Authorizing Provider  diclofenac Sodium (VOLTAREN) 1 % GEL Apply 2 g topically 4 (four) times daily. 11/25/20  Yes Janeece Agee, NP  escitalopram (LEXAPRO) 10 MG tablet TAKE 1 TABLET(10 MG) BY MOUTH DAILY 11/25/20  Yes Janeece Agee, NP  hydrocortisone cream 1 % Apply 1 application topically 2 (two) times daily. 11/25/20  Yes Janeece Agee, NP  levocetirizine (XYZAL) 5 MG tablet Take 1 tablet (5 mg total) by mouth every evening. 11/25/20  Yes Janeece Agee, NP  lisinopril-hydrochlorothiazide (ZESTORETIC) 20-25 MG tablet Take 1 tablet by mouth daily. 11/25/20  Yes Janeece Agee, NP  montelukast (SINGULAIR) 10 MG tablet Take 1 tablet (10 mg total) by mouth at bedtime. 11/25/20  Yes Janeece Agee, NP     Positive ROS: All other systems have been reviewed and were otherwise negative with the exception of those mentioned in the HPI and as above.  Physical Exam: General: Alert, no acute distress Cardiovascular: No pedal edema Respiratory: No cyanosis, no use of accessory musculature GI: abdomen soft Skin: No lesions in the area of chief complaint Neurologic: Sensation intact distally Psychiatric: Patient is competent for consent with  normal mood and affect Lymphatic: no lymphedema  MUSCULOSKELETAL: exam stable  Assessment: left carpal tunnel syndrome  Plan: Plan for Procedure(s): LEFT CARPAL TUNNEL RELEASE  The risks benefits and alternatives were discussed with the patient including but not limited to the risks of nonoperative treatment, versus surgical intervention including infection, bleeding, nerve injury,  blood clots, cardiopulmonary complications, morbidity, mortality, among others, and they were willing to proceed.   Preoperative templating of the joint replacement has been completed, documented, and submitted to the Operating Room personnel in order to optimize intra-operative equipment management.   Glee Arvin,  MD 01/01/2021 10:38 AM

## 2021-01-01 NOTE — Transfer of Care (Signed)
Immediate Anesthesia Transfer of Care Note  Patient: Jalyne E Tippett  Procedure(s) Performed: LEFT CARPAL TUNNEL RELEASE (Left: Hand)  Patient Location: PACU  Anesthesia Type:MAC  Level of Consciousness: awake, alert  and oriented  Airway & Oxygen Therapy: Patient Spontanous Breathing and Patient connected to face mask oxygen  Post-op Assessment: Report given to RN and Post -op Vital signs reviewed and stable  Post vital signs: Reviewed and stable  Last Vitals:  Vitals Value Taken Time  BP    Temp    Pulse 72 01/01/21 1317  Resp 19 01/01/21 1317  SpO2 100 % 01/01/21 1317  Vitals shown include unvalidated device data.  Last Pain:  Vitals:   01/01/21 1058  TempSrc: Oral  PainSc: 0-No pain      Patients Stated Pain Goal: 7 (71/24/58 0998)  Complications: No notable events documented.

## 2021-01-02 NOTE — Progress Notes (Signed)
Left message stating courtesy call and if any questions or concerns please call the doctors office.  

## 2021-01-03 ENCOUNTER — Encounter (HOSPITAL_BASED_OUTPATIENT_CLINIC_OR_DEPARTMENT_OTHER): Payer: Self-pay | Admitting: Orthopaedic Surgery

## 2021-01-08 ENCOUNTER — Ambulatory Visit (INDEPENDENT_AMBULATORY_CARE_PROVIDER_SITE_OTHER): Payer: 59 | Admitting: Physician Assistant

## 2021-01-08 ENCOUNTER — Encounter: Payer: Self-pay | Admitting: Orthopaedic Surgery

## 2021-01-08 ENCOUNTER — Other Ambulatory Visit: Payer: Self-pay

## 2021-01-08 DIAGNOSIS — G5602 Carpal tunnel syndrome, left upper limb: Secondary | ICD-10-CM

## 2021-01-08 DIAGNOSIS — Z9889 Other specified postprocedural states: Secondary | ICD-10-CM

## 2021-01-08 NOTE — Progress Notes (Signed)
   Post-Op Visit Note   Patient: Rebekah Fischer           Date of Birth: [birth year]           MRN: [id number] Visit Date: 01/08/2021 PCP: Janeece Agee, NP   Assessment & Plan:  Chief Complaint:  Chief Complaint  Patient presents with   Left Hand - Post-op Follow-up   Visit Diagnoses:  1. Carpal tunnel syndrome on left   2. S/P carpal tunnel release     Plan: Patient is a pleasant 65 year old female who comes in today 1 week status post left carpal tunnel release 01/01/2021.  She has been doing well.  She still notes slight paresthesias throughout the median nerve distribution.  No pain.  Examination of the left wrist reveals a well-healing surgical incision with nylon sutures in place.  No evidence of infection or cellulitis.  Fingers are warm and well-perfused.  She is neurovascular intact distally.  Today, the wound was cleaned and recovered.  Removable Velcro splint was applied which she will wear for the next week.  She will follow-up with [place] next week for suture removal.  No heavy lifting or submerging her hand in water for another 3 weeks.  Call with concerns or questions.  Follow-Up Instructions: Return in about 1 week (around 01/15/2021).   Orders:  No orders of the defined types were placed in this encounter.  No orders of the defined types were placed in this encounter.   Imaging: No new imaging  PMFS History: Patient Active Problem List   Diagnosis Date Noted   Carpal tunnel syndrome on left 01/01/2021   Alcohol use disorder, severe, in controlled environment (HCC) 09/12/2020   Alcohol-induced mood disorder with depressive symptoms (HCC) 09/12/2020   Adjustment disorder with mixed anxiety and depressed mood 09/13/2019   Past Medical History:  Diagnosis Date   Allergy    Hypertension    Right atrial enlargement     Family History  Problem Relation Age of Onset   Heart failure Mother    Hypertension Mother    Hyperlipidemia Mother    Heart disease Mother     Emphysema Father    Hypertension Brother    Cancer Sister        "blood cancer"   Hypertension Sister    Hypertension Sister    Hypertension Sister    Hypertension Sister    Hypertension Sister    Hypertension Brother     Past Surgical History:  Procedure Laterality Date   CARPAL TUNNEL RELEASE     CARPAL TUNNEL RELEASE Left 01/01/2021   Procedure: LEFT CARPAL TUNNEL RELEASE;  Surgeon: Tarry Kos, MD;  Location: [facility];  Service: Orthopedics;  Laterality: Left;   EYE SURGERY     Social History   Occupational History   Occupation: Licensed conveyancer  Tobacco Use   Smoking status: Former    Packs/day: 0.50    Types: Cigarettes    Start date: 08/11/1969    Quit date: 08/11/1984    Years since quitting: 36.4   Smokeless tobacco: Never  Vaping Use   Vaping Use: Never used  Substance and Sexual Activity   Alcohol use: Yes    Alcohol/week: 3.0 standard drinks    Types: 3 Standard drinks or equivalent per week   Drug use: No    Comment: previously used marijuana   Sexual activity: Never

## 2021-01-15 ENCOUNTER — Ambulatory Visit (INDEPENDENT_AMBULATORY_CARE_PROVIDER_SITE_OTHER): Payer: 59 | Admitting: Physician Assistant

## 2021-01-15 ENCOUNTER — Encounter: Payer: Self-pay | Admitting: Orthopaedic Surgery

## 2021-01-15 ENCOUNTER — Other Ambulatory Visit: Payer: Self-pay

## 2021-01-15 DIAGNOSIS — G5602 Carpal tunnel syndrome, left upper limb: Secondary | ICD-10-CM

## 2021-01-15 NOTE — Progress Notes (Signed)
   Post-Op Visit Note   Patient: Rebekah Fischer           Date of Birth: [birth year]           MRN: [id number] Visit Date: 01/15/2021 PCP: Janeece Agee, NP   Assessment & Plan:  Chief Complaint:  Chief Complaint  Patient presents with   Left Hand - Routine Post Op, Wound Check, Pain   Visit Diagnoses:  1. Carpal tunnel syndrome on left     Plan: Patient is a pleasant 73 old female who comes in today 2 weeks out left carpal tunnel release 01/01/2021.  She is doing well.  She notes some soreness to her thumb but nothing more.  She denies any paresthesias to the hand or fingers.  Overall, doing well.  Examination of the left hand reveals a fully healed surgical incision with nylon sutures in place.  No evidence of infection or cellulitis.  She is neurovascular intact distally.  Today, sutures were removed and Steri-Strips applied.  She will continue to avoid any heavy lifting or submerging her hand underwater for another 2 weeks.  She may discontinue the splint.  She may start range of motion exercises as tolerated.  Follow-up with [place] in 4 weeks time for recheck.  Call with concerns or questions.  Follow-Up Instructions: Return in about 4 weeks (around 02/12/2021).   Orders:  No orders of the defined types were placed in this encounter.  No orders of the defined types were placed in this encounter.   Imaging: No new imaging  PMFS History: Patient Active Problem List   Diagnosis Date Noted   Carpal tunnel syndrome on left 01/01/2021   Alcohol use disorder, severe, in controlled environment (HCC) 09/12/2020   Alcohol-induced mood disorder with depressive symptoms (HCC) 09/12/2020   Adjustment disorder with mixed anxiety and depressed mood 09/13/2019   Past Medical History:  Diagnosis Date   Allergy    Hypertension    Right atrial enlargement     Family History  Problem Relation Age of Onset   Heart failure Mother    Hypertension Mother    Hyperlipidemia Mother    Heart  disease Mother    Emphysema Father    Hypertension Brother    Cancer Sister        "blood cancer"   Hypertension Sister    Hypertension Sister    Hypertension Sister    Hypertension Sister    Hypertension Sister    Hypertension Brother     Past Surgical History:  Procedure Laterality Date   CARPAL TUNNEL RELEASE     CARPAL TUNNEL RELEASE Left 01/01/2021   Procedure: LEFT CARPAL TUNNEL RELEASE;  Surgeon: Tarry Kos, MD;  Location: [facility];  Service: Orthopedics;  Laterality: Left;   EYE SURGERY     Social History   Occupational History   Occupation: Licensed conveyancer  Tobacco Use   Smoking status: Former    Packs/day: 0.50    Types: Cigarettes    Start date: 08/11/1969    Quit date: 08/11/1984    Years since quitting: 36.4   Smokeless tobacco: Never  Vaping Use   Vaping Use: Never used  Substance and Sexual Activity   Alcohol use: Yes    Alcohol/week: 3.0 standard drinks    Types: 3 Standard drinks or equivalent per week   Drug use: No    Comment: previously used marijuana   Sexual activity: Never

## 2021-02-12 ENCOUNTER — Encounter: Payer: Self-pay | Admitting: Orthopaedic Surgery

## 2021-02-12 ENCOUNTER — Ambulatory Visit (INDEPENDENT_AMBULATORY_CARE_PROVIDER_SITE_OTHER): Payer: 59 | Admitting: Orthopaedic Surgery

## 2021-02-12 ENCOUNTER — Other Ambulatory Visit: Payer: Self-pay

## 2021-02-12 DIAGNOSIS — Z9889 Other specified postprocedural states: Secondary | ICD-10-CM

## 2021-02-12 NOTE — Progress Notes (Signed)
   Post-Op Visit Note   Patient: Rebekah Fischer           Date of Birth: 07-Apr-1956           MRN: [id number] Visit Date: 02/12/2021 PCP: Janeece Agee, NP   Assessment & Plan:  Chief Complaint:  Chief Complaint  Patient presents with   Left Hand - Pain   Visit Diagnoses:  1. S/P carpal tunnel release     Plan: Barbaraann is status post left carpal tunnel release on 01/01/2021.  She is doing well overall.  No real complaints.  She no longer has the numbness and tingling and pain since she has had the surgery.  Left hand shows a fully healed surgical scar without significant tenderness to palpation.  She lacks about half a centimeter of opposition from the thumb tip to the fifth metacarpal head.  Normal capillary refill.  Grip strength is improving.  At this point Marylu may continue to gradually increase activity and the use of the hand as tolerated.  We will see her back as needed.  Follow-Up Instructions: Return if symptoms worsen or fail to improve.   Orders:  No orders of the defined types were placed in this encounter.  No orders of the defined types were placed in this encounter.   Imaging: No results found.  PMFS History: Patient Active Problem List   Diagnosis Date Noted   Carpal tunnel syndrome on left 01/01/2021   Alcohol use disorder, severe, in controlled environment (HCC) 09/12/2020   Alcohol-induced mood disorder with depressive symptoms (HCC) 09/12/2020   Adjustment disorder with mixed anxiety and depressed mood 09/13/2019   Past Medical History:  Diagnosis Date   Allergy    Hypertension    Right atrial enlargement     Family History  Problem Relation Age of Onset   Heart failure Mother    Hypertension Mother    Hyperlipidemia Mother    Heart disease Mother    Emphysema Father    Hypertension Brother    Cancer Sister        "blood cancer"   Hypertension Sister    Hypertension Sister    Hypertension Sister    Hypertension Sister    Hypertension  Sister    Hypertension Brother     Past Surgical History:  Procedure Laterality Date   CARPAL TUNNEL RELEASE     CARPAL TUNNEL RELEASE Left 01/01/2021   Procedure: LEFT CARPAL TUNNEL RELEASE;  Surgeon: Tarry Kos, MD;  Location: [facility];  Service: Orthopedics;  Laterality: Left;   EYE SURGERY     Social History   Occupational History   Occupation: Licensed conveyancer  Tobacco Use   Smoking status: Former    Packs/day: 0.50    Types: Cigarettes    Start date: 08/11/1969    Quit date: 08/11/1984    Years since quitting: 36.5   Smokeless tobacco: Never  Vaping Use   Vaping Use: Never used  Substance and Sexual Activity   Alcohol use: Yes    Alcohol/week: 3.0 standard drinks    Types: 3 Standard drinks or equivalent per week   Drug use: No    Comment: previously used marijuana   Sexual activity: Never

## 2021-02-19 ENCOUNTER — Other Ambulatory Visit: Payer: Self-pay | Admitting: Registered Nurse

## 2021-02-19 DIAGNOSIS — J302 Other seasonal allergic rhinitis: Secondary | ICD-10-CM

## 2021-04-07 ENCOUNTER — Other Ambulatory Visit: Payer: Self-pay | Admitting: Registered Nurse

## 2021-04-07 DIAGNOSIS — I10 Essential (primary) hypertension: Secondary | ICD-10-CM

## 2021-04-16 IMAGING — CT CT HEAD W/O CM
4 series · 17 of 47 positions shown, 19 images · non-contrast
Comparison: None.

CLINICAL DATA: The acute headache with normal neuro exam

EXAM:
CT HEAD WITHOUT CONTRAST
TECHNIQUE: Contiguous axial images were obtained from the base of the skull
through the vertex without intravenous contrast.

[Series 2: head wo · axial · 0.47mm/px · z∈[+1735,+1835]mm · 7 of 28 slices shown, 9 images]
[im 4/28  brain]
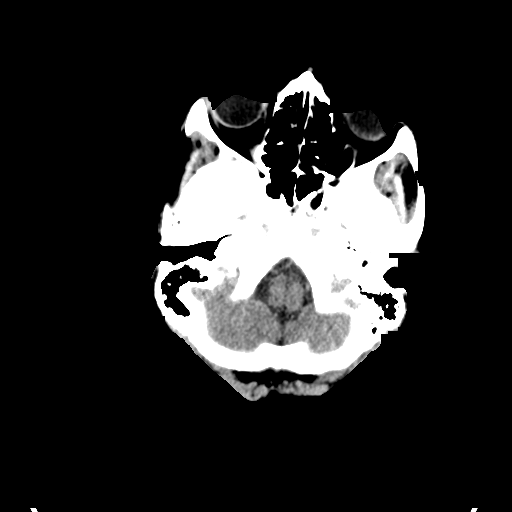
[im 4/28  bone]
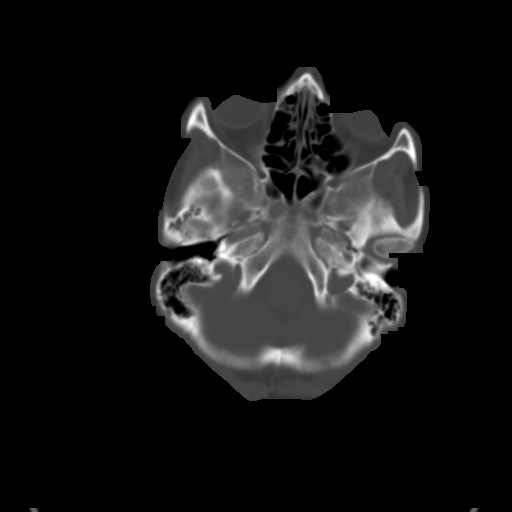
[im 7/28  brain]
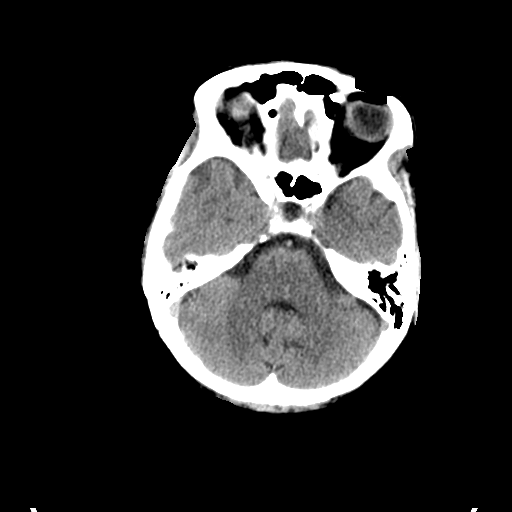
[im 11/28  brain]
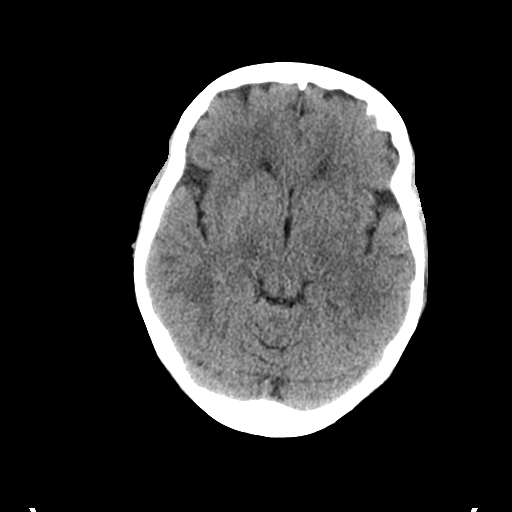
[im 14/28  brain]
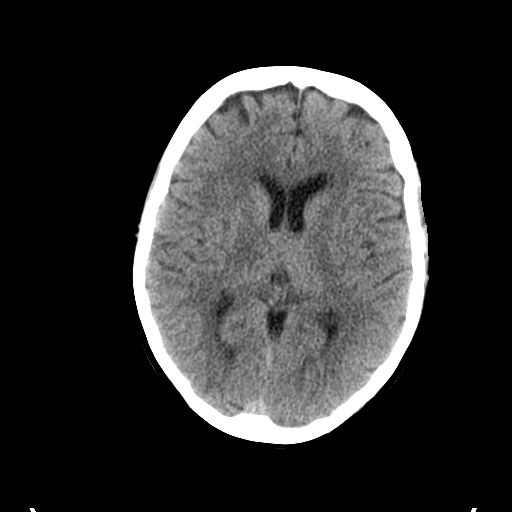
[im 17/28  brain]
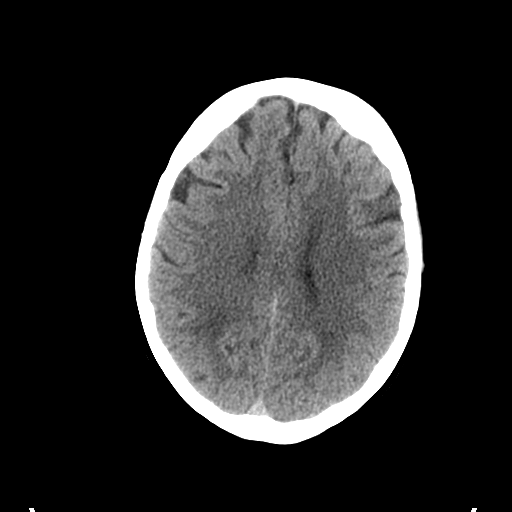
[im 17/28  bone]
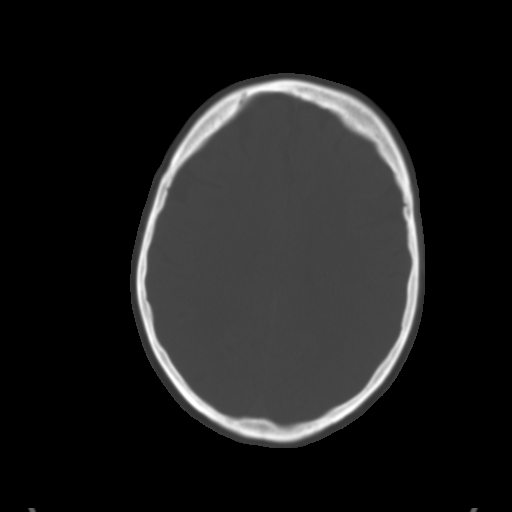
[im 21/28  brain]
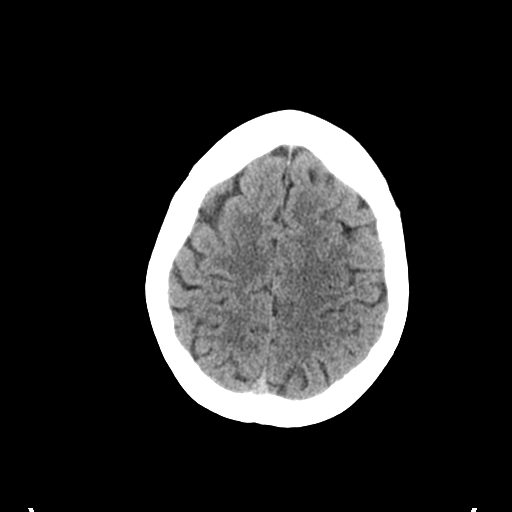
[im 24/28  brain]
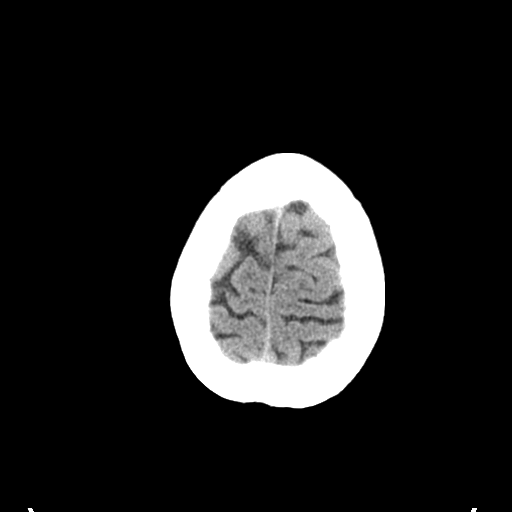

[Series 3: head bone · axial · 0.47mm/px · z∈[+1732,+1782]mm · 4 of 70 slices shown]
[im 7/70  bone]
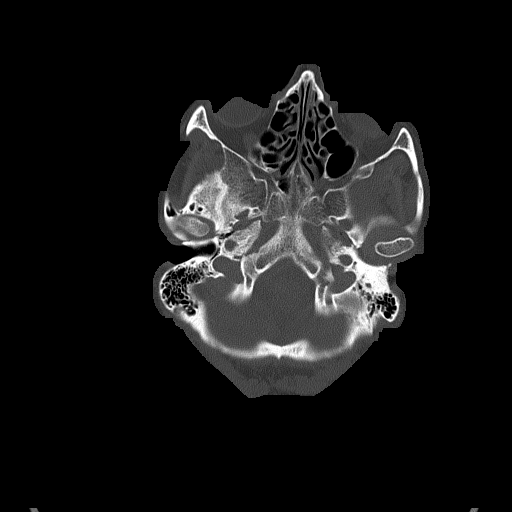
[im 14/70  bone]
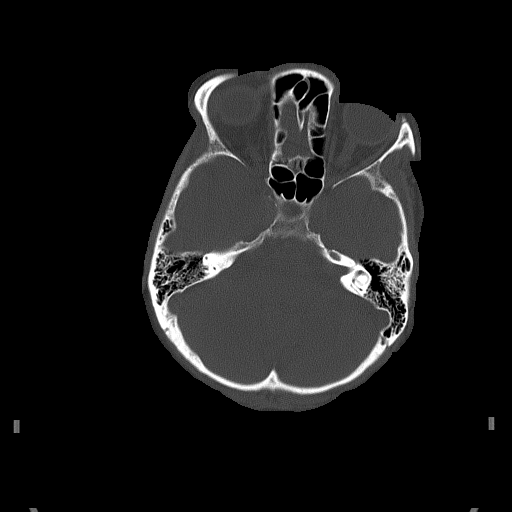
[im 21/70  bone]
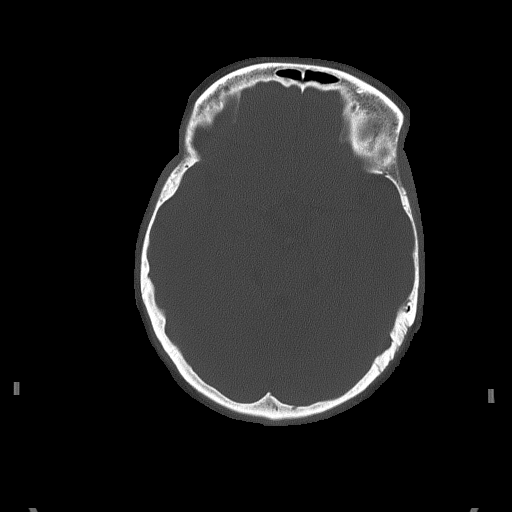
[im 32/70  bone]
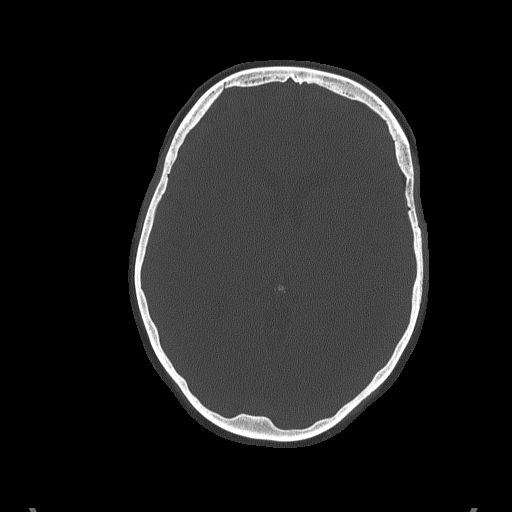

[Series 4: coronal soft tissue · coronal · 0.27mm/px · 3 of 60 slices shown]
[im 20/60  brain]
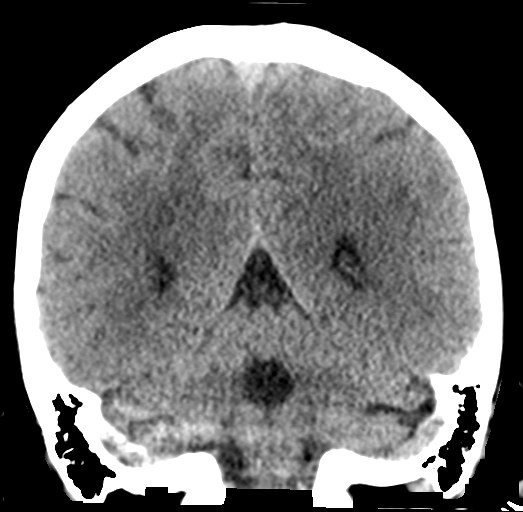
[im 27/60  brain]
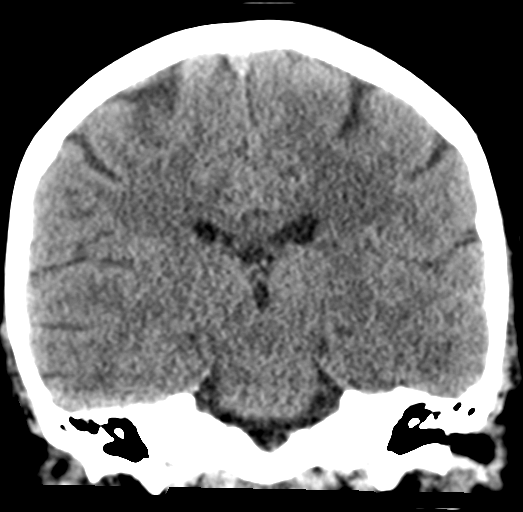
[im 33/60  brain]
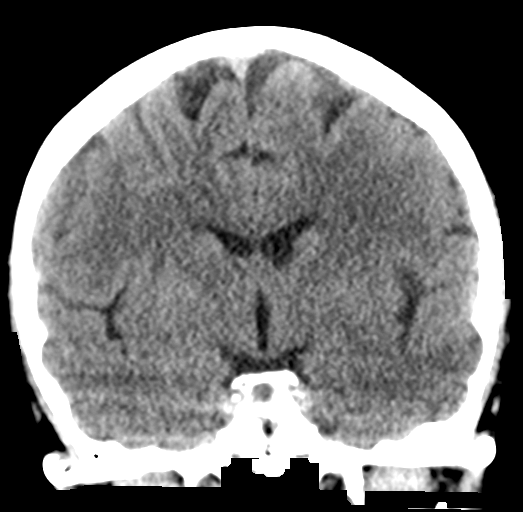

[Series 5: sagittal soft tissue · sagittal · 0.27mm/px · 3 of 47 slices shown]
[im 16/47  brain]
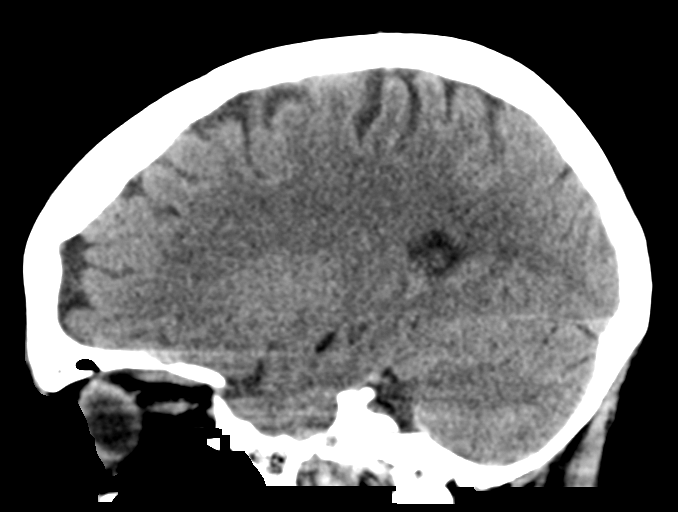
[im 24/47  brain]
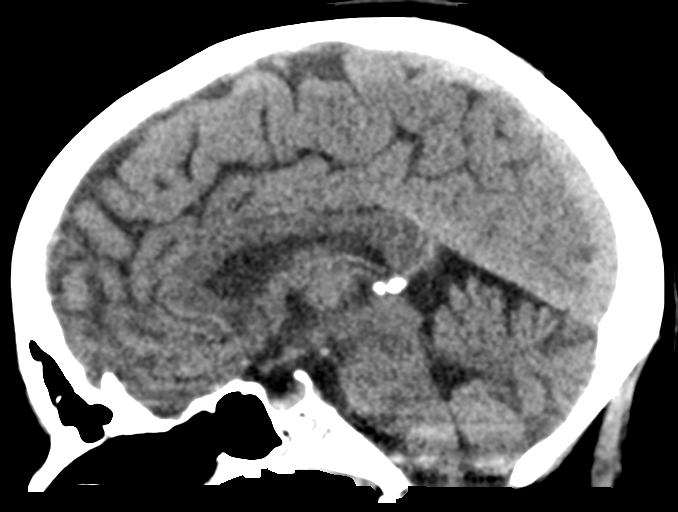
[im 31/47  brain]
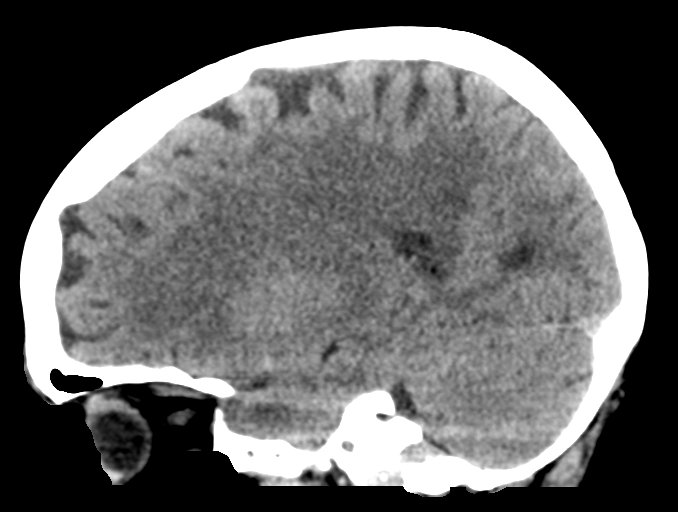

[17 of 47 positions shown; findings below may reference images not displayed]

FINDINGS: Brain: No evidence of acute infarction, hemorrhage, hydrocephalus,
extra-axial collection or mass lesion/mass effect. Mild patchy
low-density in the cerebral white matter, usually chronic small
vessel ischemia. Normal brain volume.

Vascular: No hyperdense vessel or unexpected calcification.

Skull: Normal. Negative for fracture or focal lesion.

Sinuses/Orbits: No acute finding.
IMPRESSION: 1. No acute finding.
2. Mild white matter disease

## 2021-05-20 ENCOUNTER — Other Ambulatory Visit: Payer: Self-pay | Admitting: Registered Nurse

## 2021-05-20 DIAGNOSIS — J302 Other seasonal allergic rhinitis: Secondary | ICD-10-CM

## 2021-05-28 ENCOUNTER — Encounter: Payer: Self-pay | Admitting: Registered Nurse

## 2021-05-28 ENCOUNTER — Ambulatory Visit (INDEPENDENT_AMBULATORY_CARE_PROVIDER_SITE_OTHER): Payer: Managed Care, Other (non HMO) | Admitting: Registered Nurse

## 2021-05-28 VITALS — BP 178/88 | HR 104 | Temp 98.2°F | Ht 61.0 in | Wt 137.2 lb

## 2021-05-28 DIAGNOSIS — K219 Gastro-esophageal reflux disease without esophagitis: Secondary | ICD-10-CM

## 2021-05-28 DIAGNOSIS — F4323 Adjustment disorder with mixed anxiety and depressed mood: Secondary | ICD-10-CM

## 2021-05-28 DIAGNOSIS — I10 Essential (primary) hypertension: Secondary | ICD-10-CM

## 2021-05-28 MED ORDER — FLUOXETINE HCL 20 MG PO CAPS: 20.0000 mg | ORAL_CAPSULE | Freq: Every day | ORAL | 1 refills | Status: DC

## 2021-05-28 MED ORDER — PANTOPRAZOLE SODIUM 40 MG PO TBEC: 40.0000 mg | DELAYED_RELEASE_TABLET | Freq: Every day | ORAL | 3 refills | Status: DC

## 2021-05-28 MED ORDER — AMLODIPINE BESYLATE 10 MG PO TABS: 10.0000 mg | ORAL_TABLET | Freq: Every day | ORAL | 1 refills | Status: DC

## 2021-05-28 NOTE — Patient Instructions (Signed)
Ms. Tonae Livolsi to see you!  Add amlodipine 10mg  daily for blood pressure. Check at home and let me know how it looks.  Add pantoprazole 40mg  daily in mornings as needed for indigestion. By the books, advice is to avoid trigger foods and limit stressors - I think we can acknowledge how difficult this is.  Start fluoxetine 20mg  daily in the mornings. I will check in with you in about 5-6 weeks to see how things are going. If things aren't great after 4 weeks or so, you can increase to 2 capsules (40mg ) total daily. Taken them together.  Let's touch base in 6 mo a the latest.  Thank you,  

## 2021-05-28 NOTE — Progress Notes (Signed)
Established Patient Office Visit  Subjective:  Patient ID: Rebekah Fischer, female    DOB: 1955/07/26  Age: 66 y.o. MRN: [id number]  CC:  Chief Complaint  Patient presents with   Follow-up    HPI Rebekah Fischer presents for htn  Hypertension: Patient Currently taking: lisinopril-hctz 20-25mg  po qd Subtherapeutic effect. No AEs. Denies CV symptoms including: chest pain, shob, doe, headache, visual changes, fatigue, claudication, and dependent edema.   Previous readings and labs: BP Readings from Last 3 Encounters:  05/28/21 (!) 178/88  01/01/21 (!) 147/87  11/25/20 (!) 157/71   Lab Results  Component Value Date   CREATININE 0.83 12/27/2020   GERD Ongoing Took relative's pantoprazole at one point - one does helped for many days Would like to try acid reducer of her own. No melena, nvd, weight management  Adjustment disorder Had stopped lexapro due to subtherapeutic effect.  At that time, declined starting new agent Interested now. Teary often.  No hi/si Knows her younger sister is on medication, unsure what   Past Medical History:  Diagnosis Date   Allergy    Hypertension    Right atrial enlargement     Past Surgical History:  Procedure Laterality Date   CARPAL TUNNEL RELEASE     CARPAL TUNNEL RELEASE Left 01/01/2021   Procedure: LEFT CARPAL TUNNEL RELEASE;  Surgeon: Tarry Kos, MD;  Location: [facility];  Service: Orthopedics;  Laterality: Left;   EYE SURGERY      Family History  Problem Relation Age of Onset   Heart failure Mother    Hypertension Mother    Hyperlipidemia Mother    Heart disease Mother    Emphysema Father    Hypertension Brother    Cancer Sister        "blood cancer"   Hypertension Sister    Hypertension Sister    Hypertension Sister    Hypertension Sister    Hypertension Sister    Hypertension Brother     Social History   Socioeconomic History   Marital status: Divorced    Spouse name: N/A   Number of  children: 0   Years of education: 16   Highest education level: Not on file  Occupational History   Occupation: Licensed conveyancer  Tobacco Use   Smoking status: Former    Packs/day: 0.50    Types: Cigarettes    Start date: 08/11/1969    Quit date: 08/11/1984    Years since quitting: 36.8   Smokeless tobacco: Never  Vaping Use   Vaping Use: Never used  Substance and Sexual Activity   Alcohol use: Yes    Alcohol/week: 3.0 standard drinks    Types: 3 Standard drinks or equivalent per week   Drug use: No    Comment: previously used marijuana   Sexual activity: Never  Other Topics Concern   Not on file  Social History Narrative   Lives alone.   Social Determinants of Health   Financial Resource Strain: Not on file  Food Insecurity: Not on file  Transportation Needs: Not on file  Physical Activity: Not on file  Stress: Not on file  Social Connections: Not on file  Intimate Partner Violence: Not on file    Outpatient Medications Prior to Visit  Medication Sig Dispense Refill   levocetirizine (XYZAL) 5 MG tablet Take 1 tablet (5 mg total) by mouth every evening. 90 tablet 3   lisinopril-hydrochlorothiazide (ZESTORETIC) 20-25 MG tablet TAKE 1 TABLET BY MOUTH DAILY 90  tablet 2   montelukast (SINGULAIR) 10 MG tablet TAKE ONE TABLET BY MOUTH AT BEDTIME 90 tablet 0   diclofenac Sodium (VOLTAREN) 1 % GEL Apply 2 g topically 4 (four) times daily. (Patient not taking: Reported on 05/28/2021) 50 g 1   escitalopram (LEXAPRO) 10 MG tablet TAKE 1 TABLET(10 MG) BY MOUTH DAILY (Patient not taking: Reported on 05/28/2021) 90 tablet 0   HYDROcodone-acetaminophen (NORCO) 5-325 MG tablet Take 1 tablet by mouth every 6 (six) hours as needed. (Patient not taking: Reported on 05/28/2021) 20 tablet 0   hydrocortisone cream 1 % Apply 1 application topically 2 (two) times daily. (Patient not taking: Reported on 05/28/2021) 30 g 0   No facility-administered medications prior to visit.    Allergies   Allergen Reactions   Neomycin-Polymyxin B Gu     rash   Penicillins Rash    ROS Review of Systems  Constitutional: Negative.   HENT: Negative.    Eyes: Negative.   Respiratory: Negative.    Cardiovascular: Negative.   Gastrointestinal: Negative.   Genitourinary: Negative.   Musculoskeletal: Negative.   Skin: Negative.   Neurological: Negative.   Psychiatric/Behavioral: Negative.    All other systems reviewed and are negative.    Objective:    Physical Exam Vitals and nursing note reviewed.  Constitutional:      General: She is not in acute distress.    Appearance: Normal appearance. She is normal weight. She is not ill-appearing, toxic-appearing or diaphoretic.  Cardiovascular:     Rate and Rhythm: Normal rate and regular rhythm.     Heart sounds: Normal heart sounds. No murmur heard.   No friction rub. No gallop.  Pulmonary:     Effort: Pulmonary effort is normal. No respiratory distress.     Breath sounds: Normal breath sounds. No stridor. No wheezing, rhonchi or rales.  Chest:     Chest wall: No tenderness.  Skin:    General: Skin is warm and dry.  Neurological:     General: No focal deficit present.     Mental Status: She is alert and oriented to person, place, and time. Mental status is at baseline.  Psychiatric:        Mood and Affect: Mood normal.        Behavior: Behavior normal.        Thought Content: Thought content normal.        Judgment: Judgment normal.    BP (!) 178/88 Comment: patient upset crying   Pulse (!) 104 Comment: patient upset crying   Temp 98.2 F (36.8 C) (Temporal)    Ht 5\' 1"  (1.549 m)    Wt 137 lb 3.2 oz (62.2 kg)    SpO2 98%    BMI 25.92 kg/m  Wt Readings from Last 3 Encounters:  05/28/21 137 lb 3.2 oz (62.2 kg)  01/01/21 130 lb 15.3 oz (59.4 kg)  12/12/20 129 lb (58.5 kg)     Health Maintenance Due  Topic Date Due   PAP SMEAR-Modifier  02/11/2015   COVID-19 Vaccine (2 - Booster for Janssen series) 01/04/2020    INFLUENZA VACCINE  Never done    There are no preventive care reminders to display for this patient.  Lab Results  Component Value Date   TSH 1.949 09/12/2019   Lab Results  Component Value Date   WBC 7.1 03/19/2020   HGB 12.7 03/19/2020   HCT 37.7 03/19/2020   MCV 94 03/19/2020   PLT 302 03/19/2020   Lab Results  Component Value Date   NA 130 (L) 12/27/2020   K 4.1 12/27/2020   CO2 25 12/27/2020   GLUCOSE 99 12/27/2020   BUN 32 (H) 12/27/2020   CREATININE 0.83 12/27/2020   BILITOT 1.0 09/12/2019   ALKPHOS 86 09/12/2019   AST 21 09/12/2019   ALT 15 09/12/2019   PROT 8.3 (H) 09/12/2019   ALBUMIN 5.0 09/12/2019   CALCIUM 8.9 12/27/2020   ANIONGAP 11 12/27/2020   Lab Results  Component Value Date   CHOL 183 10/03/2019   Lab Results  Component Value Date   HDL 73 10/03/2019   Lab Results  Component Value Date   LDLCALC 88 10/03/2019   Lab Results  Component Value Date   TRIG 130 10/03/2019   Lab Results  Component Value Date   CHOLHDL 2.5 10/03/2019   No results found for: HGBA1C    Assessment & Plan:   Problem List Items Addressed This Visit       Other   Adjustment disorder with mixed anxiety and depressed mood   Relevant Medications   FLUoxetine (PROZAC) 20 MG capsule   Other Visit Diagnoses     Essential hypertension    -  Primary   Relevant Medications   amLODipine (NORVASC) 10 MG tablet   Gastroesophageal reflux disease, unspecified whether esophagitis present       Relevant Medications   pantoprazole (PROTONIX) 40 MG tablet       Meds ordered this encounter  Medications   amLODipine (NORVASC) 10 MG tablet    Sig: Take 1 tablet (10 mg total) by mouth daily.    Dispense:  90 tablet    Refill:  1    Order Specific Question:   Supervising Provider    Answer:   Neva SeatGREENE, JEFFREY R [[id number]]   pantoprazole (PROTONIX) 40 MG tablet    Sig: Take 1 tablet (40 mg total) by mouth daily.    Dispense:  90 tablet    Refill:  3    Order  Specific Question:   Supervising Provider    Answer:   Neva SeatGREENE, JEFFREY R [[id number]]   FLUoxetine (PROZAC) 20 MG capsule    Sig: Take 1 capsule (20 mg total) by mouth daily.    Dispense:  90 capsule    Refill:  1    Order Specific Question:   Supervising Provider    Answer:   Neva SeatGREENE, JEFFREY R [[id number]]    Follow-up: Return in about 6 months (around 11/25/2021) for htn, labs.   PLAN Add amlodipine 10mg  po qd. Bp check in 2-3 weeks, nurse visit. Start fluoxetine 20mg  po qd. Can increase dose. Can consider adding second gen antipsychotic if resistant. Pt declines counseling/therapy referral. Start pantoprazole 40mg  po qd prn for GERD. Suggest trigger food avoidance and lifestyle modifications to avoid symptoms  Recheck with provider in 6 mo Patient encouraged to call clinic with any questions, comments, or concerns.  Janeece Ageeichard Zo Loudon, NP

## 2021-08-29 ENCOUNTER — Other Ambulatory Visit: Payer: Self-pay

## 2021-08-29 ENCOUNTER — Ambulatory Visit (INDEPENDENT_AMBULATORY_CARE_PROVIDER_SITE_OTHER): Payer: Managed Care, Other (non HMO) | Admitting: Family Medicine

## 2021-08-29 ENCOUNTER — Encounter: Payer: Self-pay | Admitting: Family Medicine

## 2021-08-29 DIAGNOSIS — J302 Other seasonal allergic rhinitis: Secondary | ICD-10-CM | POA: Diagnosis not present

## 2021-08-29 MED ORDER — AZELASTINE HCL 0.1 % NA SOLN: 2.0000 | Freq: Two times a day (BID) | NASAL | 12 refills | Status: DC

## 2021-08-29 MED ORDER — PREDNISONE 10 MG PO TABS: ORAL_TABLET | ORAL | 0 refills | Status: DC

## 2021-08-29 NOTE — Patient Instructions (Addendum)
Follow up as needed or as scheduled ?Continue the Xyzal and Montelukast daily ?ADD the Astelin nasal spray ?TAKE the Prednisone as directed- take w/ food ?Drink LOTS of water ?Call with any questions or concerns ?Hang in there!!! ? ?

## 2021-08-29 NOTE — Progress Notes (Signed)
? ?  Subjective:  ? ? Patient ID: Rebekah Fischer, female    DOB: 22-Nov-1955, 66 y.o.   MRN: [id number] ? ?HPI ?'my allergies are crazy'- pt is taking Xyzal 5mg  daily, Singulair 10mg  nightly but is still having to take Dayquil cold medication.  Pt reports 'coughing and sneezing constantly'.  + nasal congestion, watery eyes.  Pt reports sxs seem to worsen w/ age.  Pt has Flonase at home but isn't using it unless she is getting an allergy headache. ? ? ?Review of Systems ?For ROS see HPI  ?   ?Objective:  ? Physical Exam ?Vitals reviewed.  ?Constitutional:   ?   General: She is not in acute distress. ?   Appearance: Normal appearance. She is well-developed. She is not ill-appearing.  ?HENT:  ?   Head: Normocephalic and atraumatic.  ?   Right Ear: Tympanic membrane normal.  ?   Left Ear: Tympanic membrane normal.  ?   Nose: Mucosal edema and rhinorrhea present.  ?   Right Sinus: No maxillary sinus tenderness or frontal sinus tenderness.  ?   Left Sinus: No maxillary sinus tenderness or frontal sinus tenderness.  ?   Mouth/Throat:  ?   Pharynx: Posterior oropharyngeal erythema (w/ PND) present.  ?Eyes:  ?   Conjunctiva/sclera: Conjunctivae normal.  ?   Pupils: Pupils are equal, round, and reactive to light.  ?Cardiovascular:  ?   Rate and Rhythm: Normal rate and regular rhythm.  ?   Heart sounds: Normal heart sounds.  ?Pulmonary:  ?   Effort: Pulmonary effort is normal. No respiratory distress.  ?   Breath sounds: Normal breath sounds. No wheezing or rales.  ?Musculoskeletal:  ?   Cervical back: Normal range of motion and neck supple.  ?Lymphadenopathy:  ?   Cervical: No cervical adenopathy.  ?Skin: ?   General: Skin is warm and dry.  ?Neurological:  ?   General: No focal deficit present.  ?   Mental Status: She is alert and oriented to person, place, and time.  ?Psychiatric:     ?   Mood and Affect: Mood normal.     ?   Behavior: Behavior normal.     ?   Thought Content: Thought content normal.  ? ? ? ? ? ?   ?Assessment &  Plan:  ? ?Allergic Rhinitis- deteriorated.  Not well controlled despite Xyzal and Singulair.  Will start short prednisone taper to decrease inflammation and congestion and Astelin to help w/ sxs.  Pt expressed understanding and is in agreement w/ plan.  ?

## 2021-09-19 ENCOUNTER — Other Ambulatory Visit: Payer: Self-pay

## 2021-09-19 DIAGNOSIS — I10 Essential (primary) hypertension: Secondary | ICD-10-CM

## 2021-09-19 MED ORDER — LISINOPRIL-HYDROCHLOROTHIAZIDE 20-25 MG PO TABS: 1.0000 | ORAL_TABLET | Freq: Every day | ORAL | 2 refills | Status: DC

## 2021-11-28 ENCOUNTER — Other Ambulatory Visit: Payer: Self-pay | Admitting: Registered Nurse

## 2021-11-28 DIAGNOSIS — F4323 Adjustment disorder with mixed anxiety and depressed mood: Secondary | ICD-10-CM

## 2021-12-09 ENCOUNTER — Other Ambulatory Visit: Payer: Self-pay

## 2021-12-09 DIAGNOSIS — I10 Essential (primary) hypertension: Secondary | ICD-10-CM

## 2021-12-09 DIAGNOSIS — J302 Other seasonal allergic rhinitis: Secondary | ICD-10-CM

## 2021-12-09 MED ORDER — AMLODIPINE BESYLATE 10 MG PO TABS: 10.0000 mg | ORAL_TABLET | Freq: Every day | ORAL | 1 refills | Status: DC

## 2021-12-09 MED ORDER — MONTELUKAST SODIUM 10 MG PO TABS: 10.0000 mg | ORAL_TABLET | Freq: Every day | ORAL | 0 refills | Status: DC

## 2021-12-18 ENCOUNTER — Telehealth: Payer: Self-pay | Admitting: Registered Nurse

## 2021-12-18 ENCOUNTER — Other Ambulatory Visit: Payer: Self-pay

## 2021-12-18 DIAGNOSIS — J302 Other seasonal allergic rhinitis: Secondary | ICD-10-CM

## 2021-12-18 MED ORDER — LEVOCETIRIZINE DIHYDROCHLORIDE 5 MG PO TABS: 5.0000 mg | ORAL_TABLET | Freq: Every evening | ORAL | 3 refills | Status: DC

## 2021-12-18 NOTE — Telephone Encounter (Signed)
Rx refilled and attached a note stating Rebekah Fischer is no longer with our office .

## 2021-12-18 NOTE — Telephone Encounter (Signed)
Encourage patient to contact the pharmacy for refills or they can request refills through [facility]  (Please schedule appointment if patient has not been seen in over a year)    WHAT PHARMACY WOULD THEY LIKE THIS SENT TO: [facility] [phone]  MEDICATION NAME & DOSE: levocettirizine 5 mg   NOTES/COMMENTS FROM PATIENT:      Front office please notify patient: It takes 48-72 hours to process rx refill requests Ask patient to call pharmacy to ensure rx is ready before heading there.

## 2022-03-18 ENCOUNTER — Other Ambulatory Visit: Payer: Self-pay

## 2022-03-18 DIAGNOSIS — J302 Other seasonal allergic rhinitis: Secondary | ICD-10-CM

## 2022-03-18 MED ORDER — MONTELUKAST SODIUM 10 MG PO TABS: 10.0000 mg | ORAL_TABLET | Freq: Every day | ORAL | 0 refills | Status: DC

## 2022-06-03 ENCOUNTER — Other Ambulatory Visit: Payer: Self-pay | Admitting: Family Medicine

## 2022-06-03 DIAGNOSIS — I10 Essential (primary) hypertension: Secondary | ICD-10-CM

## 2022-06-24 ENCOUNTER — Other Ambulatory Visit: Payer: Self-pay

## 2022-06-24 ENCOUNTER — Telehealth: Payer: Self-pay | Admitting: Registered Nurse

## 2022-06-24 DIAGNOSIS — F4323 Adjustment disorder with mixed anxiety and depressed mood: Secondary | ICD-10-CM

## 2022-06-24 DIAGNOSIS — J302 Other seasonal allergic rhinitis: Secondary | ICD-10-CM

## 2022-06-24 DIAGNOSIS — I10 Essential (primary) hypertension: Secondary | ICD-10-CM

## 2022-06-24 DIAGNOSIS — K219 Gastro-esophageal reflux disease without esophagitis: Secondary | ICD-10-CM

## 2022-06-24 MED ORDER — PANTOPRAZOLE SODIUM 40 MG PO TBEC: 40.0000 mg | DELAYED_RELEASE_TABLET | Freq: Every day | ORAL | 3 refills | Status: DC

## 2022-06-24 MED ORDER — MONTELUKAST SODIUM 10 MG PO TABS: 10.0000 mg | ORAL_TABLET | Freq: Every day | ORAL | 0 refills | Status: DC

## 2022-06-24 MED ORDER — FLUOXETINE HCL 20 MG PO CAPS: ORAL_CAPSULE | ORAL | 1 refills | Status: DC

## 2022-06-24 MED ORDER — AMLODIPINE BESYLATE 10 MG PO TABS: ORAL_TABLET | ORAL | 1 refills | Status: DC

## 2022-06-24 NOTE — Telephone Encounter (Signed)
Refills sent in and pt aware

## 2022-06-24 NOTE — Telephone Encounter (Signed)
Encourage patient to contact the pharmacy for refills or they can request refills through [facility]  (Please schedule appointment if patient has not been seen in over a year) Last OV was 4/28   WHAT PHARMACY WOULD THEY LIKE THIS SENT TO: [facility] [place] - [address] [address] [facility] [address] NAME & DOSE: amLODipine (NORVASC) 10 MG tablet, FLUoxetine (PROZAC) 20 MG capsule,montelukast (SINGULAIR) 10 MG tablet and pantoprazole (PROTONIX) 40 MG tablet.  NOTES/COMMENTS FROM PATIENT: patient called stating that she needs refills on her medications. Patient has a new patient appt with Di Kindle on 07/24/22. Patient wants to know if someone could refill her medications for  her until her appt. The best contact number is [phone].      [place] office please notify patient: It takes 48-72 hours to process rx refill requests Ask patient to call pharmacy to ensure rx is ready before heading there.

## 2022-06-29 ENCOUNTER — Telehealth: Payer: Self-pay | Admitting: Family Medicine

## 2022-06-29 ENCOUNTER — Telehealth (INDEPENDENT_AMBULATORY_CARE_PROVIDER_SITE_OTHER): Payer: Managed Care, Other (non HMO) | Admitting: Family Medicine

## 2022-06-29 ENCOUNTER — Encounter: Payer: Self-pay | Admitting: Family Medicine

## 2022-06-29 VITALS — BP 137/67 | Temp 98.4°F | Ht 61.0 in

## 2022-06-29 DIAGNOSIS — U071 COVID-19: Secondary | ICD-10-CM

## 2022-06-29 NOTE — Progress Notes (Signed)
Virtual Visit via Video Note  I connected with Rebekah Fischer on 06/29/22 at 1:56 PM by telephone and verified that I am speaking with the correct person using two identifiers.   Patient Location: Home Provider Location: office - [facility].    I discussed the limitations, risks, security and privacy concerns of performing an evaluation and management service by telephone and the availability of in person appointments. I also discussed with the patient that there may be a patient responsible charge related to this service. The patient expressed understanding and agreed to proceed, consent obtained  Chief Complaint  Patient presents with   Covid Positive    Pt reports she is COVID positive. Pt states she took test at home this am 06/29/2022     History of Present Illness: Rebekah Fischer is a 67 y.o. female that tested positive for covid today at home today. She reports her coughing and sinus drainage become worse around Tuesday or Wednesday last week. She reports she has seasonal allergies and thought it was flaring up with working with some old files.  Then Saturday, she felt even worse and started taking OTC Alka Seltzer  Cold & Flu that has seemed to help for a few hours. Today, she woke up with a sore throat and decided to test for covid.   Denies ear pain, fever, or shortness of breath. She reports mid chest pain on Saturday afternoon while sitting on the couch. Denies any other symptoms when having the chest pain. She reports the chest pain only lasted a minute. Denies the pain radiating. Resolved on its own when she made herself belch. Denies experiencing any more chest pain.  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[place]) 09/12/2020   Adjustment disorder with mixed  anxiety and depressed mood 09/13/2019   Past Medical History:  Diagnosis Date   Allergy    Hypertension    Right atrial enlargement    Past Surgical History:  Procedure Laterality Date   CARPAL TUNNEL RELEASE     CARPAL TUNNEL RELEASE Left 01/01/2021   Procedure: LEFT CARPAL TUNNEL RELEASE;  Surgeon: Leandrew Koyanagi, MD;  Location: [facility];  Service: Orthopedics;  Laterality: Left;   EYE SURGERY     Allergies  Allergen Reactions   Neomycin-Polymyxin B Gu     rash   Penicillins Rash   Prior to Admission medications   Medication Sig Start Date End Date Taking? Authorizing Provider  amLODipine (NORVASC) 10 MG tablet TAKE 1 TABLET(10 MG) BY MOUTH DAILY 06/24/22  Yes Midge Minium, MD  azelastine (ASTELIN) 0.1 % nasal spray Place 2 sprays into both nostrils 2 (two) times daily. Use in each nostril as directed 08/29/21  Yes Midge Minium, MD  FLUoxetine (PROZAC) 20 MG capsule TAKE 1 CAPSULE(20 MG) BY MOUTH DAILY 06/24/22  Yes Midge Minium, MD  levocetirizine (XYZAL) 5 MG tablet Take 1 tablet (5 mg total) by mouth every evening. 12/18/21  Yes Wendie Agreste, MD  lisinopril-hydrochlorothiazide (ZESTORETIC) 20-25 MG tablet Take 1 tablet by mouth daily. 09/19/21  Yes Maximiano Coss, NP  montelukast (SINGULAIR) 10 MG tablet Take 1 tablet (10 mg total) by mouth at bedtime. 06/24/22  Yes Midge Minium, MD  pantoprazole (PROTONIX) 40 MG tablet Take 1 tablet (40  mg total) by mouth daily. 06/24/22  Yes Midge Minium, MD  predniSONE (DELTASONE) 10 MG tablet 3 tabs x3 days and then 2 tabs x3 days and then 1 tab x3 days.  Take w/ food. Patient not taking: Reported on 06/29/2022 08/29/21   Midge Minium, MD   Social History   Socioeconomic History   Marital status: Divorced    Spouse name: N/A   Number of children: 0   Years of education: 16   Highest education level: Not on file  Occupational History   Occupation: Public house manager  Tobacco Use    Smoking status: Former    Packs/day: 0.50    Types: Cigarettes    Start date: 08/11/1969    Quit date: 08/11/1984    Years since quitting: 37.9   Smokeless tobacco: Never  Vaping Use   Vaping Use: Never used  Substance and Sexual Activity   Alcohol use: Yes    Alcohol/week: 3.0 standard drinks of alcohol    Types: 3 Standard drinks or equivalent per week   Drug use: No    Comment: previously used marijuana   Sexual activity: Never  Other Topics Concern   Not on file  Social History Narrative   Lives alone.   Social Determinants of Health   Financial Resource Strain: Not on file  Food Insecurity: Not on file  Transportation Needs: Not on file  Physical Activity: Not on file  Stress: Not on file  Social Connections: Not on file  Intimate Partner Violence: Not on file    Observations/Objective: Today's Vitals   06/29/22 1304  BP: 137/67  Temp: 98.4 F (36.9 C)  Height: 5' 1"$  (1.549 m)   Physical Exam Pulmonary:     Comments: Able to speak in full sentences without sounding short of breath, wheezing, or gasping for air.  Neurological:     Mental Status: She is alert and oriented to person, place, and time.  Psychiatric:        Mood and Affect: Mood normal.        Thought Content: Thought content normal.        Judgment: Judgment normal.     Assessment and Plan: COVID  - Discussed about symptom management of taking OTC medication to help with symptoms, Tylenol or Ibuprofen if needed. Offered antibiotic for possible secondary infections, patient declined. Uncertain when symptoms occurred since she usually has chronic seasonal allergies. She reports her chronic seasonal allergy symptoms became worse on Tuesday or Wednesday of last week. It has been over 5 days and Paxlovid would not be beneficial with delay in treatment.  -Encouraged hydration.  -Encouraged to stay active and take deep breaths to prevent any secondary infections.  -Encouraged to take OTC vitamin D  1000IU, vitamin C 1070m, and Zinc 50-100 mg daily to help support immune system.  -Recommend to not get any vaccines for at least 2 weeks after having covid.  -Discussed about the CDC guidelines for isolation with having covid.  -Offered excuse work note, but patient reports she does not need a note.  Follow Up Instructions: No follow-ups on file. I discussed the assessment and treatment plan with the patient. The patient was provided an opportunity to ask questions and all were answered. The patient agreed with the plan and demonstrated an understanding of the instructions.   The patient was advised to call back or seek an in-person evaluation if the symptoms worsen or if the condition fails to improve as anticipated.  JValarie Merino  NP

## 2022-06-30 NOTE — Addendum Note (Signed)
Addended by: Valarie Merino R on: 06/30/2022 12:19 PM   Modules accepted: Level of Service

## 2022-07-08 NOTE — Telephone Encounter (Signed)
error 

## 2022-07-24 ENCOUNTER — Encounter: Payer: Self-pay | Admitting: Family Medicine

## 2022-07-24 ENCOUNTER — Ambulatory Visit (INDEPENDENT_AMBULATORY_CARE_PROVIDER_SITE_OTHER): Payer: Medicare HMO | Admitting: Family Medicine

## 2022-07-24 VITALS — BP 132/72 | HR 90 | Temp 98.9°F | Ht 61.0 in | Wt 135.2 lb

## 2022-07-24 DIAGNOSIS — Z1231 Encounter for screening mammogram for malignant neoplasm of breast: Secondary | ICD-10-CM | POA: Diagnosis not present

## 2022-07-24 DIAGNOSIS — Z130 Encounter for screening for diseases of the blood and blood-forming organs and certain disorders involving the immune mechanism: Secondary | ICD-10-CM | POA: Diagnosis not present

## 2022-07-24 DIAGNOSIS — E348 Other specified endocrine disorders: Secondary | ICD-10-CM | POA: Diagnosis not present

## 2022-07-24 DIAGNOSIS — Z124 Encounter for screening for malignant neoplasm of cervix: Secondary | ICD-10-CM

## 2022-07-24 DIAGNOSIS — Z13228 Encounter for screening for other metabolic disorders: Secondary | ICD-10-CM

## 2022-07-24 DIAGNOSIS — Z7689 Persons encountering health services in other specified circumstances: Secondary | ICD-10-CM | POA: Diagnosis not present

## 2022-07-24 DIAGNOSIS — Z131 Encounter for screening for diabetes mellitus: Secondary | ICD-10-CM | POA: Diagnosis not present

## 2022-07-24 DIAGNOSIS — F32A Depression, unspecified: Secondary | ICD-10-CM

## 2022-07-24 DIAGNOSIS — K219 Gastro-esophageal reflux disease without esophagitis: Secondary | ICD-10-CM

## 2022-07-24 DIAGNOSIS — F419 Anxiety disorder, unspecified: Secondary | ICD-10-CM

## 2022-07-24 DIAGNOSIS — Z13220 Encounter for screening for lipoid disorders: Secondary | ICD-10-CM | POA: Diagnosis not present

## 2022-07-24 DIAGNOSIS — I10 Essential (primary) hypertension: Secondary | ICD-10-CM

## 2022-07-24 DIAGNOSIS — Z1329 Encounter for screening for other suspected endocrine disorder: Secondary | ICD-10-CM

## 2022-07-24 DIAGNOSIS — F39 Unspecified mood [affective] disorder: Secondary | ICD-10-CM

## 2022-07-24 NOTE — Patient Instructions (Addendum)
-  It was a pleasure to meet you and I look forward to taking care of you. -Continue with Amlodipine and monitoring your blood pressure at home. If you notice your blood pressure becoming higher than 139/89, needs to follow up sooner. -Follow up for 3 month AWV -Placed a referral to GYN for pap smear -Ordered bone density and mammogram. If you do not hear back from [facility] with appointments for these scans and the referral in 2 weeks, call back to the office. -Ordered labs. Office will call with results and you may see your results on MyChart.  -Continue all other medications as prescribed.

## 2022-07-24 NOTE — Assessment & Plan Note (Signed)
Continue with Pantoprazole 40mg  daily since it is effective.

## 2022-07-24 NOTE — Progress Notes (Signed)
New Patient Office Visit  Subjective    Patient ID: Rebekah Fischer, female    DOB: 1955-12-04  Age: 67 y.o. MRN: [id number]  CC:  Chief Complaint  Patient presents with   Diabetes    Pt is here today to [place]. Pt reports she has a cough since having covid, worse at night. Pt has her B/P cuff with her today. We check and compared reading and her reading was off by 30.  Pt does not have a Ob- at this time. Pt reports she can do pap in office or send referral. She would leave this up to you.    HPI Rebekah Fischer presents to establish care with new provider and chronic management.   HTN: Chronic. Patient is prescribed Amlodipine 10mg  daily. Stop taking Lisinopril 20mg  daily because she ran out in February. She monitors her blood pressure at home; systolic range [id number], averages around [id number], diastolic Q000111Q. Denies chest pain, SHOB, dizziness, lightheadedness, headaches, or lower extremity edema.   Anxiety: Chronic. Patient takes Fluoxetine 20mg  daily. Effective with no complications.   GERD: Chronic. Patient is taking Pantoprazole 40mg  daily. Effective. Controls symptoms.   Outpatient Encounter Medications as of 07/24/2022  Medication Sig   amLODipine (NORVASC) 10 MG tablet TAKE 1 TABLET(10 MG) BY MOUTH DAILY   azelastine (ASTELIN) 0.1 % nasal spray Place 2 sprays into both nostrils 2 (two) times daily. Use in each nostril as directed   FLUoxetine (PROZAC) 20 MG capsule TAKE 1 CAPSULE(20 MG) BY MOUTH DAILY   levocetirizine (XYZAL) 5 MG tablet Take 1 tablet (5 mg total) by mouth every evening.   montelukast (SINGULAIR) 10 MG tablet Take 1 tablet (10 mg total) by mouth at bedtime.   pantoprazole (PROTONIX) 40 MG tablet Take 1 tablet (40 mg total) by mouth daily.   [DISCONTINUED] lisinopril-hydrochlorothiazide (ZESTORETIC) 20-25 MG tablet Take 1 tablet by mouth daily. (Patient not taking: Reported on 07/24/2022)   No facility-administered encounter medications on file as of 07/24/2022.     Past Medical History:  Diagnosis Date   Alcohol abuse    Allergies    Allergy    Carpal tunnel syndrome of left wrist    Hypertension    Right atrial enlargement     Past Surgical History:  Procedure Laterality Date   CARPAL TUNNEL RELEASE     CARPAL TUNNEL RELEASE Left 01/01/2021   Procedure: LEFT CARPAL TUNNEL RELEASE;  Surgeon: Leandrew Koyanagi, MD;  Location: [facility];  Service: Orthopedics;  Laterality: Left;   EYE SURGERY      Family History  Problem Relation Age of Onset   Heart failure Mother    Hypertension Mother    Hyperlipidemia Mother    Heart disease Mother    Emphysema Father    Cancer Sister        "blood cancer"   Hypertension Sister    Hypertension Sister    Hypertension Sister    Hypertension Sister    Hypertension Sister    Hypertension Brother    Hypertension Brother     Social History   Socioeconomic History   Marital status: Divorced    Spouse name: N/A   Number of children: 0   Years of education: 16   Highest education level: Not on file  Occupational History   Occupation: Publishing rights manager  Tobacco Use   Smoking status: Former    Packs/day: 0.50    Years: 16.00    Additional pack years: 0.00  Total pack years: 8.00    Types: Cigarettes    Start date: 08/11/1969    Quit date: 08/11/1984    Years since quitting: 37.9   Smokeless tobacco: Never  Vaping Use   Vaping Use: Never used  Substance and Sexual Activity   Alcohol use: Yes    Alcohol/week: 3.0 standard drinks of alcohol    Types: 3 Standard drinks or equivalent per week    Comment: Some days; beer and wine   Drug use: Not Currently    Comment: previously used marijuana   Sexual activity: Not Currently  Other Topics Concern   Not on file  Social History Narrative   Lives alone, but has a niece that stays with her 3-4 nights a week to take to school in morning. No pets.    Social Determinants of Health   Financial Resource Strain: Not on file  Food  Insecurity: Not on file  Transportation Needs: Not on file  Physical Activity: Not on file  Stress: Not on file  Social Connections: Not on file  Intimate Partner Violence: Not on file    ROS See HPI above    Objective    BP 132/72   Pulse 90   Temp 98.9 F (37.2 C)   Ht 5\' 1"  (1.549 m)   Wt 135 lb 4 oz (61.3 kg)   SpO2 98%   BMI 25.56 kg/m   Physical Exam Vitals reviewed.  Constitutional:      General: She is not in acute distress.    Appearance: Normal appearance. She is not ill-appearing, toxic-appearing or diaphoretic.  Eyes:     General:        Right eye: No discharge.        Left eye: No discharge.     Conjunctiva/sclera: Conjunctivae normal.  Cardiovascular:     Rate and Rhythm: Normal rate and regular rhythm.     Pulses:          Dorsalis pedis pulses are 3+ on the right side and 3+ on the left side.     Heart sounds: Normal heart sounds. No murmur heard.    No friction rub. No gallop.  Pulmonary:     Effort: Pulmonary effort is normal. No respiratory distress.     Breath sounds: Normal breath sounds.  Musculoskeletal:        General: Normal range of motion.     Right lower leg: No edema.     Left lower leg: No edema.  Skin:    General: Skin is warm and dry.  Neurological:     General: No focal deficit present.     Mental Status: She is alert and oriented to person, place, and time. Mental status is at baseline.  Psychiatric:        Mood and Affect: Mood normal.        Behavior: Behavior normal.        Thought Content: Thought content normal.        Judgment: Judgment normal.      Assessment & Plan:  Essential hypertension Assessment & Plan: Blood pressure is stable today. Recommend patient to continue to monitor her blood pressure. If she notices her BP becoming higher than 139/89, needs to follow up sooner. Discontinued Lisinopril since she has not been taking since February. If her blood pressure starts to elevate, will add it back. Ordered CMP  to assess kidney function.   Orders: -     Comprehensive metabolic panel  Anxiety  and depression Assessment & Plan: Continue with Fluoxetine 20mg  daily since it is effective.    Gastroesophageal reflux disease, unspecified whether esophagitis present Assessment & Plan: Continue with Pantoprazole 40mg  daily since it is effective.    Estradiol deficiency -     DG Bone Density; Future  Pap smear for cervical cancer screening -     Ambulatory referral to Gynecology  Screening for endocrine, metabolic and immunity disorder -     Comprehensive metabolic panel -     TSH -     CBC with Differential/Platelet -     Hemoglobin A1c  Lipid screening -     Lipid panel  Encounter for screening mammogram for malignant neoplasm of breast -     MM 3D DIAGNOSTIC MAMMOGRAM BILATERAL BREAST; Future  Encounter to establish care with new doctor  Mood disorder ([place])  1.Review health maintenance:  -Ordered mammogram-prefers [facility] location -Ordered bone density-prefers [facility] location - Tetanus-unsure of time, but reports she had at [place] with [facility] to obtain PNA vaccine, RSV vaccine, and covid booster at local pharmacy. Declined influenza vaccine.  2.Referral to GYN for pap smear-prefers [facility] location. 3.Ordered lipid screening. Patient has fasted for 6 hours. Ordered TSH, CBC, CMP, and A1c for screening of endocrine, metabolic, & immunity.   Return in about 3 months (around 10/24/2022) for AWV .   Valarie Merino, NP

## 2022-07-24 NOTE — Assessment & Plan Note (Signed)
Continue with Fluoxetine 20mg  daily since it is effective.

## 2022-07-24 NOTE — Assessment & Plan Note (Signed)
Blood pressure is stable today. Recommend patient to continue to monitor her blood pressure. If she notices her BP becoming higher than 139/89, needs to follow up sooner. Discontinued Lisinopril since she has not been taking since February. If her blood pressure starts to elevate, will add it back. Ordered CMP to assess kidney function.

## 2022-07-25 LAB — CBC WITH DIFFERENTIAL/PLATELET
Absolute Monocytes: 689 cells/uL (ref 200–950)
Basophils Absolute: 21 cells/uL (ref 0–200)
Basophils Relative: 0.4 %
Eosinophils Absolute: 69 cells/uL (ref 15–500)
Eosinophils Relative: 1.3 %
HCT: 37.2 % (ref 35.0–45.0)
Hemoglobin: 12.7 g/dL (ref 11.7–15.5)
Lymphs Abs: 1431 cells/uL (ref 850–3900)
MCH: 31.1 pg (ref 27.0–33.0)
MCHC: 34.1 g/dL (ref 32.0–36.0)
MCV: 91 fL (ref 80.0–100.0)
MPV: 11.5 fL (ref 7.5–12.5)
Monocytes Relative: 13 %
Neutro Abs: 3090 cells/uL (ref 1500–7800)
Neutrophils Relative %: 58.3 %
Platelets: 334 10*3/uL (ref 140–400)
RBC: 4.09 10*6/uL (ref 3.80–5.10)
RDW: 13.1 % (ref 11.0–15.0)
Total Lymphocyte: 27 %
WBC: 5.3 10*3/uL (ref 3.8–10.8)

## 2022-07-25 LAB — COMPREHENSIVE METABOLIC PANEL
AG Ratio: 1.2 (calc) (ref 1.0–2.5)
ALT: 17 U/L (ref 6–29)
AST: 24 U/L (ref 10–35)
Albumin: 4.5 g/dL (ref 3.6–5.1)
Alkaline phosphatase (APISO): 118 U/L (ref 37–153)
BUN: 12 mg/dL (ref 7–25)
CO2: 25 mmol/L (ref 20–32)
Calcium: 8.9 mg/dL (ref 8.6–10.4)
Chloride: 101 mmol/L (ref 98–110)
Creat: 0.77 mg/dL (ref 0.50–1.05)
Globulin: 3.7 g/dL (calc) (ref 1.9–3.7)
Glucose, Bld: 99 mg/dL (ref 65–99)
Potassium: 4.4 mmol/L (ref 3.5–5.3)
Sodium: 137 mmol/L (ref 135–146)
Total Bilirubin: 0.3 mg/dL (ref 0.2–1.2)
Total Protein: 8.2 g/dL — ABNORMAL HIGH (ref 6.1–8.1)

## 2022-07-25 LAB — LIPID PANEL
Cholesterol: 156 mg/dL (ref <200)
HDL: 47 mg/dL — ABNORMAL LOW (ref >=50)
LDL Cholesterol (Calc): 75 mg/dL (calc)
Non-HDL Cholesterol (Calc): 109 mg/dL (calc) (ref <130)
Total CHOL/HDL Ratio: 3.3 (calc) (ref <5.0)
Triglycerides: 244 mg/dL — ABNORMAL HIGH (ref <150)

## 2022-07-25 LAB — HEMOGLOBIN A1C
Hgb A1c MFr Bld: 5.8 % of total Hgb — ABNORMAL HIGH (ref <5.7)
Mean Plasma Glucose: 120 mg/dL
eAG (mmol/L): 6.6 mmol/L

## 2022-07-25 LAB — TSH: TSH: 1.05 mIU/L (ref 0.40–4.50)

## 2022-08-05 ENCOUNTER — Ambulatory Visit: Payer: Medicare HMO

## 2022-09-25 ENCOUNTER — Telehealth: Payer: Self-pay | Admitting: Family Medicine

## 2022-09-25 ENCOUNTER — Other Ambulatory Visit: Payer: Self-pay

## 2022-09-25 DIAGNOSIS — J302 Other seasonal allergic rhinitis: Secondary | ICD-10-CM

## 2022-09-25 MED ORDER — MONTELUKAST SODIUM 10 MG PO TABS: 10.0000 mg | ORAL_TABLET | Freq: Every day | ORAL | 0 refills | Status: DC

## 2022-09-25 NOTE — Telephone Encounter (Signed)
09/25/2022 Request refill Montelukast 10 mg Last seen in office 07/24/2022   Upcoming appt-10/26/2022

## 2022-09-25 NOTE — Telephone Encounter (Signed)
Encourage patient to contact the pharmacy for refills or they can request refills through [facility]  WHAT PHARMACY WOULD THEY LIKE THIS SENT TO:  [id number] - [place], [place] - [address]   MEDICATION NAME & DOSE: montelukast (SINGULAIR) 10 MG tablet   NOTES/COMMENTS FROM PATIENT:      Front office please notify patient: It takes 48-72 hours to process rx refill requests Ask patient to call pharmacy to ensure rx is ready before heading there.

## 2022-10-14 ENCOUNTER — Other Ambulatory Visit: Payer: Self-pay

## 2022-10-14 ENCOUNTER — Telehealth: Payer: Self-pay | Admitting: Family Medicine

## 2022-10-14 DIAGNOSIS — J302 Other seasonal allergic rhinitis: Secondary | ICD-10-CM

## 2022-10-14 MED ORDER — LEVOCETIRIZINE DIHYDROCHLORIDE 5 MG PO TABS: 5.0000 mg | ORAL_TABLET | Freq: Every evening | ORAL | 3 refills | Status: DC

## 2022-10-14 NOTE — Telephone Encounter (Signed)
Refill sent to pharmacy.   

## 2022-10-14 NOTE — Telephone Encounter (Signed)
Medication Refill Request   Medication : levocetirizine (XYZAL) 5 MG tablet   Pharmacy: [id number] - [place], [place] - [address]   Last Visit: 3.22.24  Next Visit: n/a

## 2022-10-26 ENCOUNTER — Ambulatory Visit: Payer: Medicare HMO | Admitting: Family Medicine

## 2022-10-28 ENCOUNTER — Ambulatory Visit: Payer: Medicare HMO

## 2022-11-04 ENCOUNTER — Ambulatory Visit (INDEPENDENT_AMBULATORY_CARE_PROVIDER_SITE_OTHER): Payer: Medicare Other | Admitting: *Deleted

## 2022-11-04 DIAGNOSIS — Z0000 Encounter for general adult medical examination without abnormal findings: Secondary | ICD-10-CM

## 2022-11-04 NOTE — Patient Instructions (Signed)
Rebekah Fischer , Thank you for taking time to come for your Medicare Wellness Visit. I appreciate your ongoing commitment to your health goals. Please review the following plan we discussed and let me know if I can assist you in the future.   Screening recommendations/referrals: Colonoscopy: up to date  Mammogram: education provided Bone Density: education provided Recommended yearly ophthalmology/optometry visit for glaucoma screening and checkup Recommended yearly dental visit for hygiene and checkup  Vaccinations: Influenza vaccine: up to date Pneumococcal vaccine: Education provided Tdap vaccine: Education provided Shingles vaccine: up to date    Advanced directives: Education provided      Preventive Care 65 Years and Older, Female Preventive care refers to lifestyle choices and visits with your health care provider that can promote health and wellness. What does preventive care include? A yearly physical exam. This is also called an annual well check. Dental exams once or twice a year. Routine eye exams. Ask your health care provider how often you should have your eyes checked. Personal lifestyle choices, including: Daily care of your teeth and gums. Regular physical activity. Eating a healthy diet. Avoiding tobacco and drug use. Limiting alcohol use. Practicing safe sex. Taking low-dose aspirin every day. Taking vitamin and mineral supplements as recommended by your health care provider. What happens during an annual well check? The services and screenings done by your health care provider during your annual well check will depend on your age, overall health, lifestyle risk factors, and family history of disease. Counseling  Your health care provider may ask you questions about your: Alcohol use. Tobacco use. Drug use. Emotional well-being. Home and relationship well-being. Sexual activity. Eating habits. History of falls. Memory and ability to understand  (cognition). Work and work Astronomer. Reproductive health. Screening  You may have the following tests or measurements: Height, weight, and BMI. Blood pressure. Lipid and cholesterol levels. These may be checked every 5 years, or more frequently if you are over 58 years old. Skin check. Lung cancer screening. You may have this screening every year starting at age 61 if you have a 30-pack-year history of smoking and currently smoke or have quit within the past 15 years. Fecal occult blood test (FOBT) of the stool. You may have this test every year starting at age 18. Flexible sigmoidoscopy or colonoscopy. You may have a sigmoidoscopy every 5 years or a colonoscopy every 10 years starting at age 47. Hepatitis C blood test. Hepatitis B blood test. Sexually transmitted disease (STD) testing. Diabetes screening. This is done by checking your blood sugar (glucose) after you have not eaten for a while (fasting). You may have this done every 1-3 years. Bone density scan. This is done to screen for osteoporosis. You may have this done starting at age 16. Mammogram. This may be done every 1-2 years. Talk to your health care provider about how often you should have regular mammograms. Talk with your health care provider about your test results, treatment options, and if necessary, the need for more tests. Vaccines  Your health care provider may recommend certain vaccines, such as: Influenza vaccine. This is recommended every year. Tetanus, diphtheria, and acellular pertussis (Tdap, Td) vaccine. You may need a Td booster every 10 years. Zoster vaccine. You may need this after age 4. Pneumococcal 13-valent conjugate (PCV13) vaccine. One dose is recommended after age 6. Pneumococcal polysaccharide (PPSV23) vaccine. One dose is recommended after age 7. Talk to your health care provider about which screenings and vaccines you need and how often  you need them. This information is not intended to  replace advice given to you by your health care provider. Make sure you discuss any questions you have with your health care provider. Document Released: 05/17/2015 Document Revised: 01/08/2016 Document Reviewed: 02/19/2015 Elsevier Interactive Patient Education  2017 [employer] Prevention in the Home Falls can cause injuries. They can happen to people of all ages. There are many things you can do to make your home safe and to help prevent falls. What can I do on the outside of my home? Regularly fix the edges of walkways and driveways and fix any cracks. Remove anything that might make you trip as you walk through a door, such as a raised step or threshold. Trim any bushes or trees on the path to your home. Use bright outdoor lighting. Clear any walking paths of anything that might make someone trip, such as rocks or tools. Regularly check to see if handrails are loose or broken. Make sure that both sides of any steps have handrails. Any raised decks and porches should have guardrails on the edges. Have any leaves, snow, or ice cleared regularly. Use sand or salt on walking paths during winter. Clean up any spills in your garage right away. This includes oil or grease spills. What can I do in the bathroom? Use night lights. Install grab bars by the toilet and in the tub and shower. Do not use towel bars as grab bars. Use non-skid mats or decals in the tub or shower. If you need to sit down in the shower, use a plastic, non-slip stool. Keep the floor dry. Clean up any water that spills on the floor as soon as it happens. Remove soap buildup in the tub or shower regularly. Attach bath mats securely with double-sided non-slip rug tape. Do not have throw rugs and other things on the floor that can make you trip. What can I do in the bedroom? Use night lights. Make sure that you have a light by your bed that is easy to reach. Do not use any sheets or blankets that are too big for  your bed. They should not hang down onto the floor. Have a firm chair that has side arms. You can use this for support while you get dressed. Do not have throw rugs and other things on the floor that can make you trip. What can I do in the kitchen? Clean up any spills right away. Avoid walking on wet floors. Keep items that you use a lot in easy-to-reach places. If you need to reach something above you, use a strong step stool that has a grab bar. Keep electrical cords out of the way. Do not use floor polish or wax that makes floors slippery. If you must use wax, use non-skid floor wax. Do not have throw rugs and other things on the floor that can make you trip. What can I do with my stairs? Do not leave any items on the stairs. Make sure that there are handrails on both sides of the stairs and use them. Fix handrails that are broken or loose. Make sure that handrails are as long as the stairways. Check any carpeting to make sure that it is firmly attached to the stairs. Fix any carpet that is loose or worn. Avoid having throw rugs at the top or bottom of the stairs. If you do have throw rugs, attach them to the floor with carpet tape. Make sure that you have a light  switch at the top of the stairs and the bottom of the stairs. If you do not have them, ask someone to add them for you. What else can I do to help prevent falls? Wear shoes that: Do not have high heels. Have rubber bottoms. Are comfortable and fit you well. Are closed at the toe. Do not wear sandals. If you use a stepladder: Make sure that it is fully opened. Do not climb a closed stepladder. Make sure that both sides of the stepladder are locked into place. Ask someone to hold it for you, if possible. Clearly mark and make sure that you can see: Any grab bars or handrails. First and last steps. Where the edge of each step is. Use tools that help you move around (mobility aids) if they are needed. These  include: Canes. Walkers. Scooters. Crutches. Turn on the lights when you go into a dark area. Replace any light bulbs as soon as they burn out. Set up your furniture so you have a clear path. Avoid moving your furniture around. If any of your floors are uneven, fix them. If there are any pets around you, be aware of where they are. Review your medicines with your doctor. Some medicines can make you feel dizzy. This can increase your chance of falling. Ask your doctor what other things that you can do to help prevent falls. This information is not intended to replace advice given to you by your health care provider. Make sure you discuss any questions you have with your health care provider. Document Released: 02/14/2009 Document Revised: 09/26/2015 Document Reviewed: 05/25/2014 Elsevier Interactive Patient Education  2017 [employer].

## 2022-11-04 NOTE — Progress Notes (Signed)
Subjective:   Rebekah Fischer is a 67 y.o. female who presents for an Initial Medicare Annual Wellness Visit.  Visit Complete: Virtual  I connected with  Stacie E Arlotta on 11/04/22 by a audio enabled telemedicine application and verified that I am speaking with the correct person using two identifiers.  Patient Location: Home  Provider Location: Home Office  I discussed the limitations of evaluation and management by telemedicine. The patient expressed understanding and agreed to proceed.  Patient Medicare AWV questionnaire was completed by the patient on 10-2022; I have confirmed that all information answered by patient is correct and no changes since this date.  Review of Systems     Cardiac Risk Factors include: advanced age (>81men, >52 women);hypertension;family history of premature cardiovascular disease     Objective:    Today's Vitals   There is no height or weight on file to calculate BMI.     11/04/2022    2:46 PM 01/01/2021   10:54 AM 09/12/2019    3:26 AM  Advanced Directives  Does Patient Have a Medical Advance Directive? No No No  Would patient like information on creating a medical advance directive? No - Patient declined No - Patient declined     Current Medications (verified) Outpatient Encounter Medications as of 11/04/2022  Medication Sig   amLODipine (NORVASC) 10 MG tablet TAKE 1 TABLET(10 MG) BY MOUTH DAILY   azelastine (ASTELIN) 0.1 % nasal spray Place 2 sprays into both nostrils 2 (two) times daily. Use in each nostril as directed   FLUoxetine (PROZAC) 20 MG capsule TAKE 1 CAPSULE(20 MG) BY MOUTH DAILY   levocetirizine (XYZAL) 5 MG tablet Take 1 tablet (5 mg total) by mouth every evening.   montelukast (SINGULAIR) 10 MG tablet Take 1 tablet (10 mg total) by mouth at bedtime.   pantoprazole (PROTONIX) 40 MG tablet Take 1 tablet (40 mg total) by mouth daily.   No facility-administered encounter medications on file as of 11/04/2022.    Allergies  (verified) Neomycin-polymyxin b gu and Penicillins   History: Past Medical History:  Diagnosis Date   Alcohol abuse    Allergies    Allergy    Carpal tunnel syndrome of left wrist    Hypertension    Right atrial enlargement    Past Surgical History:  Procedure Laterality Date   CARPAL TUNNEL RELEASE     CARPAL TUNNEL RELEASE Left 01/01/2021   Procedure: LEFT CARPAL TUNNEL RELEASE;  Surgeon: Tarry Kos, MD;  Location: [facility];  Service: Orthopedics;  Laterality: Left;   EYE SURGERY     Family History  Problem Relation Age of Onset   Heart failure Mother    Hypertension Mother    Hyperlipidemia Mother    Heart disease Mother    Emphysema Father    Cancer Sister        "blood cancer"   Hypertension Sister    Hypertension Sister    Hypertension Sister    Hypertension Sister    Hypertension Sister    Hypertension Brother    Hypertension Brother    Social History   Socioeconomic History   Marital status: Divorced    Spouse name: N/A   Number of children: 0   Years of education: 16   Highest education level: Not on file  Occupational History   Occupation: Nutritional therapist  Tobacco Use   Smoking status: Former    Packs/day: 0.50    Years: 16.00    Additional pack  years: 0.00    Total pack years: 8.00    Types: Cigarettes    Start date: 08/11/1969    Quit date: 08/11/1984    Years since quitting: 38.2   Smokeless tobacco: Never  Vaping Use   Vaping Use: Never used  Substance and Sexual Activity   Alcohol use: Yes    Alcohol/week: 3.0 standard drinks of alcohol    Types: 3 Standard drinks or equivalent per week    Comment: Some days; beer and wine   Drug use: Not Currently    Comment: previously used marijuana   Sexual activity: Not Currently  Other Topics Concern   Not on file  Social History Narrative   Lives alone, but has a niece that stays with her 3-4 nights a week to take to school in morning. No pets.    Social Determinants of  Health   Financial Resource Strain: Low Risk  (11/04/2022)   Overall Financial Resource Strain (CARDIA)    Difficulty of Paying Living Expenses: Not very hard  Food Insecurity: No Food Insecurity (11/04/2022)   Hunger Vital Sign    Worried About Running Out of Food in the Last Year: Never true    Ran Out of Food in the Last Year: Never true  Transportation Needs: No Transportation Needs (11/04/2022)   PRAPARE - Administrator, Civil Service (Medical): No    Lack of Transportation (Non-Medical): No  Physical Activity: Sufficiently Active (11/04/2022)   Exercise Vital Sign    Days of Exercise per Week: 4 days    Minutes of Exercise per Session: 40 min  Stress: No Stress Concern Present (11/04/2022)   [employer] of Occupational Health - Occupational Stress Questionnaire    Feeling of Stress : Not at all  Social Connections: Moderately Isolated (11/04/2022)   Social Connection and Isolation Panel [NHANES]    Frequency of Communication with Friends and Family: More than three times a week    Frequency of Social Gatherings with Friends and Family: More than three times a week    Attends Religious Services: Never    Database administrator or Organizations: Yes    Attends Engineer, structural: More than 4 times per year    Marital Status: Divorced    Tobacco Counseling Counseling given: Not Answered   Clinical Intake:  Pre-visit preparation completed: Yes  Pain : No/denies pain     Diabetes: No  How often do you need to have someone help you when you read instructions, pamphlets, or other written materials from your doctor or pharmacy?: 1 - Never  Interpreter Needed?: No  Information entered by :: Remi Haggard LPN   Activities of Daily Living    11/04/2022    2:41 PM 10/28/2022    8:43 AM  In your present state of health, do you have any difficulty performing the following activities:  Hearing? 0 0  Vision? 0 0  Difficulty concentrating or making  decisions? 0 0  Walking or climbing stairs? 0 0  Dressing or bathing? 0 0  Doing errands, shopping? 0 0  Preparing Food and eating ? N N  Using the Toilet? N N  In the past six months, have you accidently leaked urine? N N  Do you have problems with loss of bowel control? N N  Managing your Medications? N N  Managing your Finances? N N  Housekeeping or managing your Housekeeping? N N    Patient Care Team: Alveria Apley, NP as  PCP - General (Family Medicine)  Indicate any recent Medical Services you may have received from other than [facility] providers in the past year (date may be approximate).     Assessment:   This is a routine wellness examination for Toiya.  Hearing/Vision screen Hearing Screening - Comments:: No trouble hearing Vision Screening - Comments:: Not up date Has implants in eye  goes every two years Unsure of name  Dietary issues and exercise activities discussed:     Goals Addressed             This Visit's Progress    Patient Stated       Pay house off      Depression Screen    11/04/2022    2:45 PM 07/24/2022    2:29 PM 08/29/2021    2:09 PM 05/28/2021    7:58 AM 11/25/2020    3:53 PM 03/19/2020    9:42 AM 12/13/2019    3:20 PM  PHQ 2/9 Scores  PHQ - 2 Score 0 0 0 1 0 0 0  PHQ- 9 Score 0 0 0 1       Fall Risk    10/28/2022    8:43 AM 07/24/2022    2:29 PM 05/28/2021    7:58 AM 11/25/2020    3:56 PM 11/25/2020    3:53 PM  Fall Risk   Falls in the past year? 0 0 0 1 0  Number falls in past yr: 0 0 0 0 0  Injury with Fall? 0 0  1 0  Risk for fall due to :  No Fall Risks  History of fall(s) No Fall Risks  Follow up  Falls evaluation completed  Falls evaluation completed Falls evaluation completed    MEDICARE RISK AT HOME:   TIMED UP AND GO:  Was the test performed? No    Cognitive Function:        11/04/2022    2:43 PM  6CIT Screen  What Year? 0 points  What month? 0 points  What time? 0 points  Count back from 20 0 points   Months in reverse 0 points  Repeat phrase 0 points  Total Score 0 points    Immunizations Immunization History  Administered Date(s) Administered   Janssen (J&J) SARS-COV-2 Vaccination 11/09/2019   MODERNA COVID-19 SARS-COV-2 PEDS BIVALENT BOOSTER 6Y-11Y 11/09/2019, 11/28/2020, 06/24/2022   Zoster Recombinant(Shingrix) 09/15/2019, 12/13/2019    TDAP status: Due, Education has been provided regarding the importance of this vaccine. Advised may receive this vaccine at local pharmacy or Health Dept. Aware to provide a copy of the vaccination record if obtained from local pharmacy or Health Dept. Verbalized acceptance and understanding.  Flu Vaccine status: Up to date  Pneumococcal vaccine status: Due, Education has been provided regarding the importance of this vaccine. Advised may receive this vaccine at local pharmacy or Health Dept. Aware to provide a copy of the vaccination record if obtained from local pharmacy or Health Dept. Verbalized acceptance and understanding.  Covid-19 vaccine status: Information provided on how to obtain vaccines.   Qualifies for Shingles Vaccine? No   Zostavax completed Yes   Shingrix Completed?: Yes  Screening Tests Health Maintenance  Topic Date Due   DTaP/Tdap/Td (1 - Tdap) Never done   MAMMOGRAM  03/22/2015   Pneumonia Vaccine 53+ Years old (1 of 1 - PCV) Never done   DEXA SCAN  Never done   COVID-19 Vaccine (5 - 2023-24 season) 08/19/2022   INFLUENZA VACCINE  12/03/2022  Fecal DNA (Cologuard)  04/17/2023   Medicare Annual Wellness (AWV)  11/04/2023   Hepatitis C Screening  Completed   Zoster Vaccines- Shingrix  Completed   HPV VACCINES  Aged Out   Colonoscopy  Discontinued    Health Maintenance  Health Maintenance Due  Topic Date Due   DTaP/Tdap/Td (1 - Tdap) Never done   MAMMOGRAM  03/22/2015   Pneumonia Vaccine 55+ Years old (1 of 1 - PCV) Never done   DEXA SCAN  Never done   COVID-19 Vaccine (5 - 2023-24 season) 08/19/2022     Colorectal cancer screening: Type of screening: Cologuard. Completed 2021. Repeat every 3 years  Mammogram status: Ordered  . Pt provided with contact info and advised to call to schedule appt.   Bone Density status: Ordered  . Pt provided with contact info and advised to call to schedule appt.  Lung Cancer Screening: (Low Dose CT Chest recommended if Age 68-80 years, 20 pack-year currently smoking OR have quit w/in 15years.) does not qualify.   Lung Cancer Screening Referral:   Additional Screening:  Hepatitis C Screening: does not qualify; Completed 2021  Vision Screening: Recommended annual ophthalmology exams for early detection of glaucoma and other disorders of the eye. Is the patient up to date with their annual eye exam?   Who is the provider or what is the name of the office in which the patient attends annual eye exams? Unsure of name If pt is not established with a provider, would they like to be referred to a provider to establish care? No .   Dental Screening: Recommended annual dental exams for proper oral hygiene  Diabetic Foot Exam:   Community Resource Referral / Chronic Care Management: CRR required this visit?  No   CCM required this visit?  No     Plan:     I have personally reviewed and noted the following in the patient's chart:   Medical and social history Use of alcohol, tobacco or illicit drugs  Current medications and supplements including opioid prescriptions. Patient is not currently taking opioid prescriptions. Functional ability and status Nutritional status Physical activity Advanced directives List of other physicians Hospitalizations, surgeries, and ER visits in previous 12 months Vitals Screenings to include cognitive, depression, and falls Referrals and appointments  In addition, I have reviewed and discussed with patient certain preventive protocols, quality metrics, and best practice recommendations. A written personalized  care plan for preventive services as well as general preventive health recommendations were provided to patient.     Remi Haggard, LPN   05/09/1094   After Visit Summary: (MyChart) Due to this being a telephonic visit, the after visit summary with patients personalized plan was offered to patient via MyChart   Nurse Notes:

## 2022-12-25 ENCOUNTER — Other Ambulatory Visit: Payer: Self-pay | Admitting: Family Medicine

## 2022-12-25 DIAGNOSIS — J302 Other seasonal allergic rhinitis: Secondary | ICD-10-CM

## 2023-01-05 ENCOUNTER — Other Ambulatory Visit: Payer: Self-pay

## 2023-01-05 DIAGNOSIS — F4323 Adjustment disorder with mixed anxiety and depressed mood: Secondary | ICD-10-CM

## 2023-01-05 MED ORDER — FLUOXETINE HCL 20 MG PO CAPS
ORAL_CAPSULE | ORAL | 0 refills | Status: DC
Start: 2023-01-05 — End: 2023-04-07

## 2023-03-26 ENCOUNTER — Other Ambulatory Visit: Payer: Self-pay | Admitting: Family Medicine

## 2023-03-26 DIAGNOSIS — J302 Other seasonal allergic rhinitis: Secondary | ICD-10-CM

## 2023-03-26 NOTE — Telephone Encounter (Signed)
Encourage patient to contact the pharmacy for refills or they can request refills through [facility]   WHAT PHARMACY WOULD THEY LIKE THIS SENT TO:  [facility] #[id number] - [place], [place] - [address] AT [facility] OF [address]  [address], [place] [address]    MEDICATION NAME & DOSE: amLODipine (NORVASC) 10 MG tablet   NOTES/COMMENTS FROM PATIENT:      Front office please notify patient: It takes 48-72 hours to process rx refill requests Ask patient to call pharmacy to ensure rx is ready before heading there.

## 2023-04-05 ENCOUNTER — Telehealth: Payer: Self-pay | Admitting: Family Medicine

## 2023-04-05 ENCOUNTER — Other Ambulatory Visit: Payer: Self-pay

## 2023-04-05 DIAGNOSIS — I10 Essential (primary) hypertension: Secondary | ICD-10-CM

## 2023-04-05 MED ORDER — AMLODIPINE BESYLATE 10 MG PO TABS
ORAL_TABLET | ORAL | 0 refills | Status: DC
Start: 2023-04-05 — End: 2024-01-07

## 2023-04-05 MED ORDER — AMLODIPINE BESYLATE 10 MG PO TABS
ORAL_TABLET | ORAL | 1 refills | Status: DC
Start: 2023-04-05 — End: 2023-04-05

## 2023-04-05 NOTE — Telephone Encounter (Signed)
Patient has been requesting refill without answer from PCP will send refill temporarily

## 2023-04-05 NOTE — Telephone Encounter (Signed)
Name First: Rebekah Last: Fischer Gender: Female DOB: 09/03/1955 Age: 67 Y 7 M 19 D Return Phone Number: [phone] (Primary) Address: City/ State/ Zip: [place]  [address] Client [facility] Night - C Client Site [facility] - Night Provider AA - PHYSICIAN, NOT LISTED- MD Contact Type Call Who Is Calling Patient / Member / Family / Caregiver Call Type Triage / Clinical Relationship To Patient Self Return Phone Number [phone] (Primary) Chief Complaint Prescription Refill or Medication Request (non symptomatic) Reason for Call Symptomatic / Request for Health Information Initial Comment Caller sates in last week, she had called 3 times for a BP Rx refill. She is all out, she had no symptoms, pharmacy needs new Rx, she says she can't wait for the office to open. Translation No Nurse Assessment Nurse: Violeta Gelinas, RN, Regulatory affairs officer (Eastern Time): 04/02/2023 1:12:58 PM Confirm and document reason for call. If symptomatic, describe symptoms. ---Pt has been out of BP medicine for 8 days and states that [facility] wouldn't give her any medicine. haven't checked blood pressure and denies symptoms. Does the patient have any new or worsening symptoms? ---No Nurse: Violeta Gelinas, RN, Regulatory affairs officer (Eastern Time): 04/02/2023 1:14:30 PM Please select the assessment type ---Refill Does the patient have enough medication to last until the office opens? ---No Disp. Time Lamount Cohen Time) Disposition Final User 04/02/2023 1:17:55 PM Clinical Call Yes Violeta Gelinas, RN, Amber Final Disposition 04/02/2023 1:17:55 PM Clinical Call Yes Violeta Gelinas, RN, Amber PLEASE NOTE: All timestamps contained within this report are represented as [place] Standard Time. CONFIDENTIALTY NOTICE: This fax transmission is intended only for the addressee. It contains information that is legally privileged, confidential or otherwise protected from use or  disclosure. If you are not the intended recipient, you are strictly prohibited from reviewing, disclosing, copying using or disseminating any of this information or taking any action in reliance on or regarding this information. If you have received this fax in error, please notify us immediately by telephone so that we can arrange for its return to [place]. Phone: [phone], Toll-Free: [phone], Fax: [phone] Page: 1 of 1 Call Id: [id number]

## 2023-04-05 NOTE — Telephone Encounter (Signed)
Prescription Request  04/05/2023  LOV: 07/24/2022  What is the name of the medication or equipment? Amlodipine. Pt states her pharmacy is requiring an authorization for refill. Pt will run out of medication tomorrow.   Have you contacted your pharmacy to request a refill? Yes   Which pharmacy would you like this sent to? [facility] #[id number] [place], [place] - [address] AT [facility] OF [address] [facility] [address] [place] [address] Phone: [phone] Fax: [phone]   Patient notified that their request is being sent to the clinical staff for review and that they should receive a response within 2 business days.   Please advise at Mobile [phone] (mobile)

## 2023-04-05 NOTE — Telephone Encounter (Signed)
Refill sent.

## 2023-04-07 ENCOUNTER — Other Ambulatory Visit: Payer: Self-pay | Admitting: *Deleted

## 2023-04-07 DIAGNOSIS — F4323 Adjustment disorder with mixed anxiety and depressed mood: Secondary | ICD-10-CM

## 2023-04-07 MED ORDER — FLUOXETINE HCL 20 MG PO CAPS
ORAL_CAPSULE | ORAL | 0 refills | Status: DC
Start: 2023-04-07 — End: 2023-04-15

## 2023-04-14 ENCOUNTER — Telehealth: Payer: Self-pay | Admitting: Family Medicine

## 2023-04-14 DIAGNOSIS — F4323 Adjustment disorder with mixed anxiety and depressed mood: Secondary | ICD-10-CM

## 2023-04-14 NOTE — Telephone Encounter (Signed)
Pt says pharmacy does not have the rx for FLUoxetine (PROZAC) 20 MG capsule that was sent on 04/07/23. asking that it be resent

## 2023-04-15 MED ORDER — FLUOXETINE HCL 20 MG PO CAPS
ORAL_CAPSULE | ORAL | 0 refills | Status: DC
Start: 2023-04-15 — End: 2023-10-21

## 2023-04-15 NOTE — Telephone Encounter (Signed)
Refill sent.

## 2023-04-15 NOTE — Addendum Note (Signed)
Addended by: Kern Reap B on: 04/15/2023 09:03 AM   Modules accepted: Orders

## 2023-05-08 ENCOUNTER — Ambulatory Visit (HOSPITAL_COMMUNITY)
Admission: EM | Admit: 2023-05-08 | Discharge: 2023-05-08 | Disposition: A | Discharge status: Home / Self Care | Payer: Medicare Other | Attending: Emergency Medicine | Admitting: Emergency Medicine

## 2023-05-08 ENCOUNTER — Encounter (HOSPITAL_COMMUNITY): Payer: Self-pay

## 2023-05-08 DIAGNOSIS — H5789 Other specified disorders of eye and adnexa: Secondary | ICD-10-CM

## 2023-05-08 MED ORDER — OFLOXACIN 0.3 % OP SOLN: 1.0000 [drp] | Freq: Four times a day (QID) | OPHTHALMIC | 0 refills | Status: AC

## 2023-05-08 NOTE — ED Triage Notes (Signed)
 Pt states that she has some right eye swelling. X1 day

## 2023-05-08 NOTE — ED Provider Notes (Signed)
 [facility]   [id number] 05/08/23 Arrival Time: 1004  Chief Complaint  Patient presents with   Eye Problem    Right eye swelling x1 day     SUBJECTIVE:  Rebekah Fischer is a 68 y.o. female who presents to the urgent care with a complaint of right eye swelling throughout the past 1 day.  Denies a precipitating event, trauma, or close contacts with similar symptoms.  Has not tried any OTC medication.  Denies any alleviating or aggravating factors.  Denies similar symptoms in the past.  Denies fever, chills, nausea, vomiting, eye pain, painful eye movements, halos, discharge, itching, vision changes, double vision, FB sensation, periorbital erythema.     Denies contact lens use.    ROS: As per HPI.  All other pertinent ROS negative.     Past Medical History:  Diagnosis Date   Alcohol abuse    Allergies    Allergy    Carpal tunnel syndrome of left wrist    Hypertension    Right atrial enlargement    Past Surgical History:  Procedure Laterality Date   CARPAL TUNNEL RELEASE     CARPAL TUNNEL RELEASE Left 01/01/2021   Procedure: LEFT CARPAL TUNNEL RELEASE;  Surgeon: Jerri Kay HERO, MD;  Location: [facility];  Service: Orthopedics;  Laterality: Left;   EYE SURGERY     Allergies  Allergen Reactions   Neomycin-Polymyxin B Gu     rash   Penicillins Rash   No current facility-administered medications on file prior to encounter.   Current Outpatient Medications on File Prior to Encounter  Medication Sig Dispense Refill   amLODipine  (NORVASC ) 10 MG tablet TAKE 1 TABLET(10 MG) BY MOUTH DAILY 90 tablet 0   azelastine  (ASTELIN ) 0.1 % nasal spray PLACE 2 SPRAYS INTO BOTH NOSTRILS 2 TIMES DAILY 30 mL 12   FLUoxetine  (PROZAC ) 20 MG capsule TAKE 1 CAPSULE(20 MG) BY MOUTH DAILY 90 capsule 0   levocetirizine (XYZAL ) 5 MG tablet Take 1 tablet (5 mg total) by mouth every evening. 90 tablet 3   montelukast  (SINGULAIR ) 10 MG tablet TAKE 1 TABLET(10 MG) BY MOUTH AT BEDTIME 90  tablet 0   pantoprazole  (PROTONIX ) 40 MG tablet Take 1 tablet (40 mg total) by mouth daily. 90 tablet 3   Social History   Socioeconomic History   Marital status: Divorced    Spouse name: N/A   Number of children: 0   Years of education: 16   Highest education level: Not on file  Occupational History   Occupation: Nutritional Therapist  Tobacco Use   Smoking status: Former    Current packs/day: 0.00    Average packs/day: 0.5 packs/day for 16.0 years (8.0 ttl pk-yrs)    Types: Cigarettes    Start date: 08/11/1969    Quit date: 08/11/1984    Years since quitting: 38.7   Smokeless tobacco: Never  Vaping Use   Vaping status: Never Used  Substance and Sexual Activity   Alcohol use: Yes    Alcohol/week: 3.0 standard drinks of alcohol    Types: 3 Standard drinks or equivalent per week    Comment: Some days; beer and wine   Drug use: Not Currently    Comment: previously used marijuana   Sexual activity: Not Currently  Other Topics Concern   Not on file  Social History Narrative   Lives alone, but has a niece that stays with her 3-4 nights a week to take to school in morning. No pets.  Social Drivers of Corporate Investment Banker Strain: Low Risk  (11/04/2022)   Overall Financial Resource Strain (CARDIA)    Difficulty of Paying Living Expenses: Not very hard  Food Insecurity: No Food Insecurity (11/04/2022)   Hunger Vital Sign    Worried About Running Out of Food in the Last Year: Never true    Ran Out of Food in the Last Year: Never true  Transportation Needs: No Transportation Needs (11/04/2022)   PRAPARE - Administrator, Civil Service (Medical): No    Lack of Transportation (Non-Medical): No  Physical Activity: Sufficiently Active (11/04/2022)   Exercise Vital Sign    Days of Exercise per Week: 4 days    Minutes of Exercise per Session: 40 min  Stress: No Stress Concern Present (11/04/2022)   [employer] of Occupational Health - Occupational Stress Questionnaire     Feeling of Stress : Not at all  Social Connections: Moderately Isolated (11/04/2022)   Social Connection and Isolation Panel [NHANES]    Frequency of Communication with Friends and Family: More than three times a week    Frequency of Social Gatherings with Friends and Family: More than three times a week    Attends Religious Services: Never    Database Administrator or Organizations: Yes    Attends Engineer, Structural: More than 4 times per year    Marital Status: Divorced  Intimate Partner Violence: Not At Risk (11/04/2022)   Humiliation, Afraid, Rape, and Kick questionnaire    Fear of Current or Ex-Partner: No    Emotionally Abused: No    Physically Abused: No    Sexually Abused: No   Family History  Problem Relation Age of Onset   Heart failure Mother    Hypertension Mother    Hyperlipidemia Mother    Heart disease Mother    Emphysema Father    Cancer Sister        blood cancer   Hypertension Sister    Hypertension Sister    Hypertension Sister    Hypertension Sister    Hypertension Sister    Hypertension Brother    Hypertension Brother     OBJECTIVE:    Visual Acuity  Right Eye Distance:   Left Eye Distance:   Bilateral Distance:    Right Eye Near:   Left Eye Near:    Bilateral Near:      Vitals:   05/08/23 1024 05/08/23 1025  BP:  (!) 165/84  Pulse:  78  Resp:  16  Temp:  98.4 F (36.9 C)  TempSrc:  Oral  SpO2:  98%  Weight: 125 lb (56.7 kg)   Height: 5' 1 (1.549 m)      Physical Exam Vitals and nursing note reviewed.  Constitutional:      General: She is not in acute distress.    Appearance: Normal appearance. She is normal weight. She is not ill-appearing, toxic-appearing or diaphoretic.  HENT:     Head: Normocephalic.  Eyes:     General: Lids are normal. Vision grossly intact. Gaze aligned appropriately. No visual field deficit.       Right eye: No foreign body or discharge.     Conjunctiva/sclera:     Right eye: Right conjunctiva  is not injected. No chemosis or exudate. Cardiovascular:     Rate and Rhythm: Normal rate and regular rhythm.     Pulses: Normal pulses.     Heart sounds: Normal heart sounds. No murmur heard.  No friction rub. No gallop.  Pulmonary:     Effort: Pulmonary effort is normal. No respiratory distress.     Breath sounds: Normal breath sounds. No stridor. No wheezing, rhonchi or rales.  Chest:     Chest wall: No tenderness.  Neurological:     Mental Status: She is alert and oriented to person, place, and time.      ASSESSMENT & PLAN:  1. Eye swelling, right     Meds ordered this encounter  Medications   ofloxacin  (OCUFLOX ) 0.3 % ophthalmic solution    Sig: Place 1 drop into the right eye 4 (four) times daily.    Dispense:  5 mL    Refill:  0    Discharge Instructions  Use eye drops as prescribed and to completion Follow up with PCP Return or follow up with PCP if symptoms persists such as fever, chills, redness, swelling, eye pain, painful eye movements, vision changes, etc...   Reviewed expectations re: course of current medical issues. Questions answered. Outlined signs and symptoms indicating need for more acute intervention. Patient verbalized understanding. After Visit Summary given.    Quention Mcneill S, FNP 05/08/23 1110

## 2023-05-08 NOTE — Discharge Instructions (Signed)
 Use eye drops as prescribed and to completion Follow up with PCP Return or follow up with PCP if symptoms persists such as fever, chills, redness, swelling, eye pain, painful eye movements, vision changes, etc..Marland Kitchen

## 2023-06-13 ENCOUNTER — Other Ambulatory Visit: Payer: Self-pay | Admitting: Family Medicine

## 2023-06-13 DIAGNOSIS — K219 Gastro-esophageal reflux disease without esophagitis: Secondary | ICD-10-CM

## 2023-06-14 NOTE — Telephone Encounter (Signed)
 Mistakenly sent to [facility]

## 2023-09-11 ENCOUNTER — Other Ambulatory Visit: Payer: Self-pay | Admitting: Family Medicine

## 2023-09-11 DIAGNOSIS — J302 Other seasonal allergic rhinitis: Secondary | ICD-10-CM

## 2023-09-14 ENCOUNTER — Other Ambulatory Visit: Payer: Self-pay | Admitting: Family Medicine

## 2023-09-14 DIAGNOSIS — J302 Other seasonal allergic rhinitis: Secondary | ICD-10-CM

## 2023-10-02 ENCOUNTER — Other Ambulatory Visit: Payer: Self-pay | Admitting: Family Medicine

## 2023-10-02 DIAGNOSIS — I10 Essential (primary) hypertension: Secondary | ICD-10-CM

## 2023-10-21 ENCOUNTER — Other Ambulatory Visit: Payer: Self-pay | Admitting: Family Medicine

## 2023-10-21 DIAGNOSIS — F4323 Adjustment disorder with mixed anxiety and depressed mood: Secondary | ICD-10-CM

## 2023-10-21 DIAGNOSIS — J302 Other seasonal allergic rhinitis: Secondary | ICD-10-CM

## 2023-12-03 ENCOUNTER — Ambulatory Visit: Payer: Self-pay

## 2023-12-03 NOTE — Telephone Encounter (Signed)
 FYI Only or Action Required?: FYI only for provider.  Patient was last seen in primary care on 07/24/2022 by Billy Philippe SAUNDERS, NP.  Called Nurse Triage reporting Eye Drainage.  Symptoms began several weeks ago.  Interventions attempted: Other: eye drops.  Symptoms are: unchanged.  Triage Disposition: See HCP Within 4 Hours (Or PCP Triage) - pt will go to [facility]  Patient/caregiver understands and will follow disposition?: Yes                   Copied from CRM [id number]. Topic: Clinical - Red Word Triage >> Dec 03, 2023 11:43 AM Macario HERO wrote: Red Word that prompted transfer to Nurse Triage: Patient stated that she thinks she may have an eye infection. It's been a few weeks and her eyes are still itchy, watery , burns, blurred vision and when she wakes up there is crust around her eye. Reason for Disposition  MODERATE eye pain or discomfort (e.g., interferes with normal activities or awakens from sleep; more than mild)  Answer Assessment - Initial Assessment Questions 1. LOCATION: Where is the redness? (e.g., eyeball or outer eyelids) Note: When callers say the eye is red, they usually mean the sclera (white of the eye) is red.        2. ONE OR BOTH EYES: Is the redness in one or both eyes?      2-3 weeks ago - both eyes 3. ONSET: When did the redness start? (e.g., hours, days)      2-3 weeks 4. EYELIDS: Are the eyelids red or swollen? If Yes, ask: How much?      no 5. VISION: Do you have blurred vision?     Yes - goop in eyes 6. ITCHING: Does it feel itchy? If so ask: How bad is it (Scale 1-10; or mild, moderate, severe)     yes 7. PAIN: Is it painful? If Yes, ask: How bad is the pain? (Scale 0-10; or none, mild, moderate, severe)     burning 8. CONTACT LENS: Do you wear contacts?     no 9. CAUSE: What do you think is causing the redness?     allergies 10. OTHER SYMPTOMS: Do you have any other symptoms? (e.g., fever, runny nose, cough,  vomiting)       Cough every morning  Protocols used: Eye - Redness-A-AH

## 2023-12-08 ENCOUNTER — Other Ambulatory Visit: Payer: Self-pay | Admitting: Family Medicine

## 2023-12-08 DIAGNOSIS — I10 Essential (primary) hypertension: Secondary | ICD-10-CM

## 2023-12-10 ENCOUNTER — Other Ambulatory Visit: Payer: Self-pay | Admitting: Family Medicine

## 2023-12-10 DIAGNOSIS — J302 Other seasonal allergic rhinitis: Secondary | ICD-10-CM

## 2023-12-11 ENCOUNTER — Other Ambulatory Visit: Payer: Self-pay | Admitting: Family Medicine

## 2023-12-11 DIAGNOSIS — F4323 Adjustment disorder with mixed anxiety and depressed mood: Secondary | ICD-10-CM

## 2023-12-28 ENCOUNTER — Ambulatory Visit: Admitting: Family Medicine

## 2024-01-07 ENCOUNTER — Ambulatory Visit (INDEPENDENT_AMBULATORY_CARE_PROVIDER_SITE_OTHER): Admitting: Family Medicine

## 2024-01-07 ENCOUNTER — Encounter: Payer: Self-pay | Admitting: Family Medicine

## 2024-01-07 VITALS — BP 132/78 | HR 80 | Temp 98.3°F | Ht 61.0 in | Wt 130.0 lb

## 2024-01-07 DIAGNOSIS — E785 Hyperlipidemia, unspecified: Secondary | ICD-10-CM

## 2024-01-07 DIAGNOSIS — R7303 Prediabetes: Secondary | ICD-10-CM

## 2024-01-07 DIAGNOSIS — I10 Essential (primary) hypertension: Secondary | ICD-10-CM | POA: Diagnosis not present

## 2024-01-07 DIAGNOSIS — F419 Anxiety disorder, unspecified: Secondary | ICD-10-CM

## 2024-01-07 DIAGNOSIS — Z1231 Encounter for screening mammogram for malignant neoplasm of breast: Secondary | ICD-10-CM

## 2024-01-07 DIAGNOSIS — K219 Gastro-esophageal reflux disease without esophagitis: Secondary | ICD-10-CM | POA: Diagnosis not present

## 2024-01-07 DIAGNOSIS — F32A Depression, unspecified: Secondary | ICD-10-CM

## 2024-01-07 DIAGNOSIS — J302 Other seasonal allergic rhinitis: Secondary | ICD-10-CM

## 2024-01-07 DIAGNOSIS — Z124 Encounter for screening for malignant neoplasm of cervix: Secondary | ICD-10-CM

## 2024-01-07 DIAGNOSIS — E348 Other specified endocrine disorders: Secondary | ICD-10-CM

## 2024-01-07 DIAGNOSIS — Z1211 Encounter for screening for malignant neoplasm of colon: Secondary | ICD-10-CM

## 2024-01-07 LAB — LIPID PANEL
Cholesterol: 171 mg/dL (ref 0–200)
HDL: 52.9 mg/dL (ref >39.00)
LDL Cholesterol: 96 mg/dL (ref 0–99)
NonHDL: 118.23
Total CHOL/HDL Ratio: 3
Triglycerides: 109 mg/dL (ref 0.0–149.0)
VLDL: 21.8 mg/dL (ref 0.0–40.0)

## 2024-01-07 LAB — HEMOGLOBIN A1C: Hgb A1c MFr Bld: 6.1 % (ref 4.6–6.5)

## 2024-01-07 LAB — COMPREHENSIVE METABOLIC PANEL WITH GFR
ALT: 12 U/L (ref 0–35)
AST: 24 U/L (ref 0–37)
Albumin: 4 g/dL (ref 3.5–5.2)
Alkaline Phosphatase: 91 U/L (ref 39–117)
BUN: 16 mg/dL (ref 6–23)
CO2: 28 meq/L (ref 19–32)
Calcium: 8.9 mg/dL (ref 8.4–10.5)
Chloride: 100 meq/L (ref 96–112)
Creatinine, Ser: 0.75 mg/dL (ref 0.40–1.20)
GFR: 82.39 mL/min (ref >60.00)
Glucose, Bld: 82 mg/dL (ref 70–99)
Potassium: 4.2 meq/L (ref 3.5–5.1)
Sodium: 137 meq/L (ref 135–145)
Total Bilirubin: 0.3 mg/dL (ref 0.2–1.2)
Total Protein: 9.1 g/dL — ABNORMAL HIGH (ref 6.0–8.3)

## 2024-01-07 MED ORDER — AMLODIPINE BESYLATE 10 MG PO TABS: ORAL_TABLET | ORAL | 1 refills | Status: DC

## 2024-01-07 NOTE — Assessment & Plan Note (Signed)
 Discontinued Fluoxetine  20mg  daily. Recommend to monitor her anxiety and if it becomes unmanageable, will need to restart medication.

## 2024-01-07 NOTE — Progress Notes (Signed)
 Established Patient Office Visit   Subjective:  Patient ID: Rebekah Fischer, female    DOB: 26-Sep-1955  Age: 68 y.o. MRN: [id number]  Chief Complaint  Patient presents with   Medical Management of Chronic Issues    HPI HTN: Chronic. Patient is prescribed Amlodipine  10mg  daily. She took her last dose yesterday. Did not have medication this morning. Denies monitoring her blood pressure at home. Denies chest pain, SHOB, lightheadedness, headaches, or lower extremity edema. Had a facial last week, when she stood up, she was dizzy. Resolved within a few seconds. No other episodes of dizziness.    Anxiety: Chronic. Patient has stopped taking Fluoxetine  20mg  daily. She stopped taking it about a month ago when she forgot the medication while traveling. Not having any anxiety without the medication.    GERD: Chronic. Patient is taking Pantoprazole  40mg  daily. Effective. Controls symptoms.   Seasonal/Environmental Allergies: Chronic. Patient is using Azelastine  nasal spray. Using it about 2-3 times a week. Taking Levocetirizine 5mg  daily and Montelukast  10mg  daily.   ROS See HPI above     Objective:   BP 132/78   Pulse 80   Temp 98.3 F (36.8 C) (Oral)   Ht 5' 1 (1.549 m)   Wt 130 lb (59 kg)   SpO2 95%   BMI 24.56 kg/m    Physical Exam Vitals reviewed.  Constitutional:      General: She is not in acute distress.    Appearance: Normal appearance. She is normal weight. She is not ill-appearing, toxic-appearing or diaphoretic.  HENT:     Head: Normocephalic and atraumatic.  Eyes:     General:        Right eye: No discharge.        Left eye: No discharge.     Conjunctiva/sclera: Conjunctivae normal.  Cardiovascular:     Rate and Rhythm: Normal rate and regular rhythm.     Heart sounds: Normal heart sounds. No murmur heard.    No friction rub. No gallop.  Pulmonary:     Effort: Pulmonary effort is normal. No respiratory distress.     Breath sounds: Normal breath sounds.   Musculoskeletal:        General: Normal range of motion.     Right lower leg: No edema.     Left lower leg: No edema.  Skin:    General: Skin is warm and dry.  Neurological:     General: No focal deficit present.     Mental Status: She is alert and oriented to person, place, and time. Mental status is at baseline.  Psychiatric:        Mood and Affect: Mood normal.        Behavior: Behavior normal.        Thought Content: Thought content normal.        Judgment: Judgment normal.      Assessment & Plan:  Gastroesophageal reflux disease without esophagitis Assessment & Plan: Continue with Pantoprazole  40mg  daily since it is effective.    Essential hypertension Assessment & Plan: Blood pressure was initially elevated today. Suspect it is due to not taking medication this morning. Refilled Amlodipine  10mg  daily. Recommend to monitor blood pressure at home. If it consistently above 140/90, please follow up sooner. Ordered CMP to assess kidney function.   Orders: -     amLODIPine  Besylate; TAKE 1 TABLET(10 MG) BY MOUTH DAILY  Dispense: 90 tablet; Refill: 1 -     Comprehensive metabolic panel with GFR  Anxiety and depression Assessment & Plan: Discontinued Fluoxetine  20mg  daily. Recommend to monitor her anxiety and if it becomes unmanageable, will need to restart medication.    Seasonal allergies Assessment & Plan: Continue Azelastine  nasal spray, Levocetirizine 5mg  daily, and Montelukast  10mg  daily.     Cervical cancer screening -     Ambulatory referral to Gynecology  Estradiol deficiency -     DG Bone Density; Future  Encounter for screening mammogram for malignant neoplasm of breast -     3D Screening Mammogram, Left and Right; Future  Colon cancer screening -     Cologuard  Elevated lipids -     Lipid panel  Prediabetes -     Hemoglobin A1c  1.Review health maintenance: -Covid booster: 2025 [facility] [address] -Dexa scan: Ordered -Mammogram:  Ordered -Cervical cancer screening: Referred to GYN at [facility]  -Tdap vaccine: Declines  -Influenza vaccine: Declines  -Cologuard: Order  -PNA vaccine: Will obtain at local pharmacy  -AWV: Need visit  2.Ordered labs (A1c and lipid) based on previous abnormal levels.  Return in about 6 months (around 07/06/2024) for physical: AWV-telehealth .   Nashton Belson, NP

## 2024-01-07 NOTE — Patient Instructions (Signed)
-  It was good to see you today. -Blood pressure is elevated today. Suspect it is due to not taking medication this morning. Refilled Amlodipine  10mg  daily. Recommend to monitor blood pressure at home. If it consistently above 140/90, please follow up sooner. -Continue all other medications.  -Ordered bone density scan, mammogram, and cologuard.  -Placed a referral to GYN. Please call the office or send a MyChart message if you do not receive a phone call or a MyChart message about appointment in 2 weeks.  -Ordered labs. Office will call with results and will be available on MyChart.  -Follow up in 6 months for a physical and Annual Wellness Visit via telehealth.

## 2024-01-07 NOTE — Assessment & Plan Note (Signed)
Continue with Pantoprazole 40mg  daily since it is effective.

## 2024-01-07 NOTE — Assessment & Plan Note (Addendum)
 Blood pressure was initially elevated today. Suspect it is due to not taking medication this morning. Refilled Amlodipine  10mg  daily. Recommend to monitor blood pressure at home. If it consistently above 140/90, please follow up sooner. Ordered CMP to assess kidney function.

## 2024-01-07 NOTE — Assessment & Plan Note (Signed)
 Continue Azelastine  nasal spray, Levocetirizine 5mg  daily, and Montelukast  10mg  daily.

## 2024-01-09 ENCOUNTER — Ambulatory Visit: Payer: Self-pay | Admitting: Family Medicine

## 2024-01-25 ENCOUNTER — Encounter: Payer: Self-pay | Admitting: Family Medicine

## 2024-01-25 ENCOUNTER — Telehealth (INDEPENDENT_AMBULATORY_CARE_PROVIDER_SITE_OTHER): Admitting: Family Medicine

## 2024-01-25 VITALS — Ht 61.0 in

## 2024-01-25 DIAGNOSIS — L2989 Other pruritus: Secondary | ICD-10-CM

## 2024-01-25 MED ORDER — PREDNISONE 20 MG PO TABS: ORAL_TABLET | ORAL | 0 refills | Status: AC

## 2024-01-25 MED ORDER — FAMOTIDINE 20 MG PO TABS: 20.0000 mg | ORAL_TABLET | Freq: Two times a day (BID) | ORAL | 0 refills | Status: AC

## 2024-01-25 NOTE — Progress Notes (Signed)
 Virtual Visit via Video Note I connected with Rebekah Fischer on 01/25/24 by a video enabled telemedicine application and verified that I am speaking with the correct person using two identifiers. Location patient: home Location provider:work office Persons participating in the virtual visit: patient, provider  I discussed the limitations of evaluation and management by telemedicine and the availability of in person appointments. The patient expressed understanding and agreed to proceed.  Chief Complaint  Patient presents with   Rash    Patient complains of rash, x1 day,    HPI: Rebekah Fischer is a 68 yo female with PHM significant for seasonal allergies, alcohol abuse, and HTN c/o pruritic rash that she noted yesterday, initially upper chest and neck. This morning she noted rash on face,upper and lower extremities. Not sure if she has any on her back. No associated edema or oral lesions. No recent URI.  Rash This is a new problem. The current episode started yesterday. The problem has been gradually worsening since onset. The rash is diffuse. The rash is characterized by redness and itchiness. She was exposed to nothing. Pertinent negatives include no anorexia, congestion, cough, diarrhea, eye pain, facial edema, fatigue, fever, joint pain, nail changes, rhinorrhea, shortness of breath, sore throat or vomiting. Past treatments include antihistamine. Her past medical history is significant for allergies.   She has had skin rashes in the past but not like this one.  Negative for new medication, detergent, soap, or body product. No known insect bite or outdoor exposures to plants. No sick contact.  OTC medication for this problem: None. She take antihistaminics for allergies, so did not want to have med interaction.  Negative for abdominal pain,nausea, dysuria, gross hematuria,.  ROS: See pertinent positives and negatives per HPI.  Past Medical History:  Diagnosis Date   Alcohol abuse     Allergies    Allergy    Carpal tunnel syndrome of left wrist    Hypertension    Right atrial enlargement     Past Surgical History:  Procedure Laterality Date   CARPAL TUNNEL RELEASE     CARPAL TUNNEL RELEASE Left 01/01/2021   Procedure: LEFT CARPAL TUNNEL RELEASE;  Surgeon: Jerri Kay HERO, MD;  Location: [facility];  Service: Orthopedics;  Laterality: Left;   EYE SURGERY      Family History  Problem Relation Age of Onset   Heart failure Mother    Hypertension Mother    Hyperlipidemia Mother    Heart disease Mother    Emphysema Father    Cancer Sister        blood cancer   Hypertension Sister    Hypertension Sister    Hypertension Sister    Hypertension Sister    Hypertension Sister    Hypertension Brother    Hypertension Brother    Social History   Socioeconomic History   Marital status: Divorced    Spouse name: N/A   Number of children: 0   Years of education: 16   Highest education level: Not on file  Occupational History   Occupation: Nutritional therapist  Tobacco Use   Smoking status: Former    Current packs/day: 0.00    Average packs/day: 0.5 packs/day for 16.0 years (8.0 ttl pk-yrs)    Types: Cigarettes    Start date: 08/11/1969    Quit date: 08/11/1984    Years since quitting: 39.4   Smokeless tobacco: Never  Vaping Use   Vaping status: Never Used  Substance and Sexual Activity  Alcohol use: Yes    Alcohol/week: 3.0 standard drinks of alcohol    Types: 3 Standard drinks or equivalent per week    Comment: Some days; beer and wine   Drug use: Not Currently    Comment: previously used marijuana   Sexual activity: Not Currently  Other Topics Concern   Not on file  Social History Narrative   Lives alone, but has a niece that stays with her 3-4 nights a week to take to school in morning. No pets.    Social Drivers of Corporate investment banker Strain: Low Risk  (11/04/2022)   Overall Financial Resource Strain (CARDIA)    Difficulty of  Paying Living Expenses: Not very hard  Food Insecurity: No Food Insecurity (11/04/2022)   Hunger Vital Sign    Worried About Running Out of Food in the Last Year: Never true    Ran Out of Food in the Last Year: Never true  Transportation Needs: No Transportation Needs (11/04/2022)   PRAPARE - Administrator, Civil Service (Medical): No    Lack of Transportation (Non-Medical): No  Physical Activity: Sufficiently Active (11/04/2022)   Exercise Vital Sign    Days of Exercise per Week: 4 days    Minutes of Exercise per Session: 40 min  Stress: No Stress Concern Present (11/04/2022)   [employer] of Occupational Health - Occupational Stress Questionnaire    Feeling of Stress : Not at all  Social Connections: Moderately Isolated (11/04/2022)   Social Connection and Isolation Panel    Frequency of Communication with Friends and Family: More than three times a week    Frequency of Social Gatherings with Friends and Family: More than three times a week    Attends Religious Services: Never    Database administrator or Organizations: Yes    Attends Engineer, structural: More than 4 times per year    Marital Status: Divorced  Intimate Partner Violence: Not At Risk (11/04/2022)   Humiliation, Afraid, Rape, and Kick questionnaire    Fear of Current or Ex-Partner: No    Emotionally Abused: No    Physically Abused: No    Sexually Abused: No     Current Outpatient Medications:    amLODipine  (NORVASC ) 10 MG tablet, TAKE 1 TABLET(10 MG) BY MOUTH DAILY, Disp: 90 tablet, Rfl: 1   azelastine  (ASTELIN ) 0.1 % nasal spray, PLACE 2 SPRAYS INTO BOTH NOSTRILS 2 TIMES DAILY, Disp: 30 mL, Rfl: 12   famotidine  (PEPCID ) 20 MG tablet, Take 1 tablet (20 mg total) by mouth 2 (two) times daily for 14 days., Disp: 28 tablet, Rfl: 0   levocetirizine (XYZAL ) 5 MG tablet, TAKE 1 TABLET(5 MG) BY MOUTH EVERY EVENING, Disp: 90 tablet, Rfl: 3   montelukast  (SINGULAIR ) 10 MG tablet, TAKE 1 TABLET BY MOUTH  EVERY DAY, Disp: 90 tablet, Rfl: 0   ofloxacin  (OCUFLOX ) 0.3 % ophthalmic solution, Place 1 drop into the right eye 4 (four) times daily., Disp: 5 mL, Rfl: 0   pantoprazole  (PROTONIX ) 40 MG tablet, TAKE 1 TABLET(40 MG) BY MOUTH DAILY, Disp: 90 tablet, Rfl: 3   predniSONE  (DELTASONE ) 20 MG tablet, 2 tab for 4 days, 1 tab for 3 days, 1/2 tab for 3 days and stop., Disp: 13 tablet, Rfl: 0  EXAM:  VITALS per patient if applicable:Ht 5' 1 (1.549 m)   BMI 24.56 kg/m   GENERAL: alert, oriented, appears well and in no acute distress  HEENT: atraumatic, conjunctiva clear, no obvious  abnormalities on inspection of external nose and ears  NECK: normal movements of the head and neck  LUNGS: on inspection no signs of respiratory distress, breathing rate appears normal, no obvious gross SOB, gasping or wheezing  CV: no obvious cyanosis  Rebekah: moves all visible extremities without noticeable abnormality.  SKIN: Noted macular , non raise , erythematous rash around ankles. I cannot see in detail rash on face or neck.  PSYCH/NEURO: pleasant and cooperative, no obvious depression or anxiety, speech and thought processing grossly intact  ASSESSMENT AND PLAN:  Discussed the following assessment and plan:  Pruritic erythematous rash - Plan: predniSONE  (DELTASONE ) 20 MG tablet, famotidine  (PEPCID ) 20 MG tablet We discussed possible etiologies. Hx does not suggest an infectious process. She is already taking Xyzal  5 mg daily and Singulair  10 mg daily for allergic rhinitis. She can add Pepcid  20 mg bid for 2 weeks. Prednisone  treatment recommended, recommend taking it with breakfast. Monitor for new symptoms. We discussed some side effects of medications. If rash is persistent, she needs to arrange in office appt with PCP.  We discussed possible serious and likely etiologies, options for evaluation and workup, limitations of telemedicine visit vs in person visit, treatment, treatment risks and  precautions. The patient was advised to call back or seek an in-person evaluation if the symptoms worsen or if the condition fails to improve as anticipated. I discussed the assessment and treatment plan with the patient. The patient was provided an opportunity to ask questions and all were answered. The patient agreed with the plan and demonstrated an understanding of the instructions.  Return if symptoms worsen or fail to improve.  Layth Cerezo [place], MD

## 2024-02-16 LAB — COLOGUARD: COLOGUARD: NEGATIVE

## 2024-03-13 ENCOUNTER — Ambulatory Visit

## 2024-03-14 ENCOUNTER — Ambulatory Visit (INDEPENDENT_AMBULATORY_CARE_PROVIDER_SITE_OTHER): Admitting: Family Medicine

## 2024-03-14 ENCOUNTER — Encounter: Payer: Self-pay | Admitting: Family Medicine

## 2024-03-14 DIAGNOSIS — Z0000 Encounter for general adult medical examination without abnormal findings: Secondary | ICD-10-CM | POA: Diagnosis not present

## 2024-03-14 NOTE — Progress Notes (Signed)
 ----------------------------------------------------------------------------------------------------------------------------------------------------------------------------------------------------------------------  Because this visit was a virtual/telehealth visit, some criteria may be missing or patient reported. Any vitals not documented were not able to be obtained and vitals that have been documented are patient reported.    MEDICARE ANNUAL PREVENTIVE VISIT WITH PROVIDER: (Welcome to Medicare, initial annual wellness or annual wellness exam)  Virtual Visit via Video Note  I connected with Rebekah Fischer on 03/14/24 by a video enabled telemedicine application and verified that I am speaking with the correct person using two identifiers.  Location patient: care, [place] Location provider:work or home office Persons participating in the virtual visit: patient, provider  Concerns and/or follow up today: detailed intake and risks/health assessment completed in flowsheets and below - please see for details.   How often do you have a drink containing alcohol?1x per week How many drinks containing alcohol do you have on a typical day when you are drinking?1 drink How often do you have six or more drinks on one occasion?never Have you ever smoked?y Quit date if applicable? Quit in 86  How many packs a day do/did you smoke? na Do you use smokeless tobacco?n Do you use an illicit drugs?n Do you feel safe at home?n Last dentist visit? Goes on a regular basis Last eye Exam and location?doesn't go often   See HM section in Epic for other details of completed HM.    ROS: negative for report of fevers, unintentional weight loss, vision changes, vision loss, hearing loss or change, chest pain, sob, hemoptysis, melena, hematochezia, hematuria, falls, bleeding or bruising, thoughts of suicide or self harm, memory loss  Patient-completed extensive health risk assessment - reviewed and discussed with the  patient: See Health Risk Assessment completed with patient prior to the visit either above or in recent phone note. This was reviewed in detailed with the patient today and appropriate recommendations, orders and referrals were placed as needed per Summary below and patient instructions.   Review of Medical History: -PMH, PSH, Family History and current specialty and care providers reviewed and updated and listed below   Patient Care Team: Billy Philippe SAUNDERS, NP as PCP - General (Family Medicine)   Past Medical History:  Diagnosis Date   Alcohol abuse    Allergies    Allergy    Carpal tunnel syndrome of left wrist    Hypertension    Right atrial enlargement     Past Surgical History:  Procedure Laterality Date   CARPAL TUNNEL RELEASE     CARPAL TUNNEL RELEASE Left 01/01/2021   Procedure: LEFT CARPAL TUNNEL RELEASE;  Surgeon: Jerri Kay HERO, MD;  Location: [facility];  Service: Orthopedics;  Laterality: Left;   EYE SURGERY      Social History   Socioeconomic History   Marital status: Divorced    Spouse name: N/A   Number of children: 0   Years of education: 16   Highest education level: Not on file  Occupational History   Occupation: Nutritional Therapist  Tobacco Use   Smoking status: Former    Current packs/day: 0.00    Average packs/day: 0.5 packs/day for 16.0 years (8.0 ttl pk-yrs)    Types: Cigarettes    Start date: 08/11/1969    Quit date: 08/11/1984    Years since quitting: 39.6   Smokeless tobacco: Never  Vaping Use   Vaping status: Never Used  Substance and Sexual Activity   Alcohol use: Yes    Alcohol/week: 3.0 standard drinks of alcohol    Types:  3 Standard drinks or equivalent per week    Comment: Some days; beer and wine   Drug use: Not Currently    Comment: previously used marijuana   Sexual activity: Not Currently  Other Topics Concern   Not on file  Social History Narrative   Lives alone, but has a niece that stays with her 3-4 nights a  week to take to school in morning. No pets.    Social Drivers of Corporate Investment Banker Strain: Low Risk  (11/04/2022)   Overall Financial Resource Strain (CARDIA)    Difficulty of Paying Living Expenses: Not very hard  Food Insecurity: No Food Insecurity (03/14/2024)   Hunger Vital Sign    Worried About Running Out of Food in the Last Year: Never true    Ran Out of Food in the Last Year: Never true  Transportation Needs: No Transportation Needs (03/14/2024)   PRAPARE - Administrator, Civil Service (Medical): No    Lack of Transportation (Non-Medical): No  Physical Activity: Inactive (03/14/2024)   Exercise Vital Sign    Days of Exercise per Week: 0 days    Minutes of Exercise per Session: 0 min  Stress: No Stress Concern Present (03/14/2024)   [employer] of Occupational Health - Occupational Stress Questionnaire    Feeling of Stress: Not at all  Social Connections: Moderately Isolated (03/14/2024)   Social Connection and Isolation Panel    Frequency of Communication with Friends and Family: More than three times a week    Frequency of Social Gatherings with Friends and Family: Twice a week    Attends Religious Services: Never    Database Administrator or Organizations: Yes    Attends Engineer, Structural: More than 4 times per year    Marital Status: Divorced  Intimate Partner Violence: Not At Risk (11/04/2022)   Humiliation, Afraid, Rape, and Kick questionnaire    Fear of Current or Ex-Partner: No    Emotionally Abused: No    Physically Abused: No    Sexually Abused: No    Family History  Problem Relation Age of Onset   Heart failure Mother    Hypertension Mother    Hyperlipidemia Mother    Heart disease Mother    Emphysema Father    Cancer Sister        blood cancer   Hypertension Sister    Hypertension Sister    Hypertension Sister    Hypertension Sister    Hypertension Sister    Hypertension Brother    Hypertension Brother      Current Outpatient Medications on File Prior to Visit  Medication Sig Dispense Refill   amLODipine  (NORVASC ) 10 MG tablet TAKE 1 TABLET(10 MG) BY MOUTH DAILY 90 tablet 1   azelastine  (ASTELIN ) 0.1 % nasal spray PLACE 2 SPRAYS INTO BOTH NOSTRILS 2 TIMES DAILY 30 mL 12   famotidine  (PEPCID ) 20 MG tablet Take 1 tablet (20 mg total) by mouth 2 (two) times daily for 14 days. 28 tablet 0   levocetirizine (XYZAL ) 5 MG tablet TAKE 1 TABLET(5 MG) BY MOUTH EVERY EVENING 90 tablet 3   montelukast  (SINGULAIR ) 10 MG tablet TAKE 1 TABLET BY MOUTH EVERY DAY 90 tablet 0   pantoprazole  (PROTONIX ) 40 MG tablet TAKE 1 TABLET(40 MG) BY MOUTH DAILY 90 tablet 3   ofloxacin  (OCUFLOX ) 0.3 % ophthalmic solution Place 1 drop into the right eye 4 (four) times daily. (Patient not taking: Reported on 03/14/2024) 5 mL 0  No current facility-administered medications on file prior to visit.    Allergies  Allergen Reactions   Neomycin-Polymyxin B Gu     rash   Penicillins Rash       Physical Exam Vitals requested from patient and listed below if patient had equipment and was able to obtain at home for this virtual visit: There were no vitals filed for this visit. Estimated body mass index is 24.56 kg/m as calculated from the following:   Height as of 01/25/24: 5' 1 (1.549 m).   Weight as of 01/07/24: 130 lb (59 kg).  EKG (optional): deferred due to virtual visit  GENERAL: alert, oriented, no acute distress detected, full vision exam deferred due to pandemic and/or virtual encounter   HEENT: atraumatic, conjunttiva clear, no obvious abnormalities on inspection of external nose and ears  NECK: normal movements of the head and neck  LUNGS: on inspection no signs of respiratory distress, breathing rate appears normal, no obvious gross SOB, gasping or wheezing  CV: no obvious cyanosis  MS: moves all visible extremities without noticeable abnormality  PSYCH/NEURO: pleasant and cooperative, no obvious  depression or anxiety, speech and thought processing grossly intact, Cognitive function grossly intact  Flowsheet Row Clinical Support from 03/14/2024 in [facility] [place]  PHQ-9 Total Score 0        03/14/2024    3:33 PM 01/07/2024    8:18 AM 11/04/2022    2:45 PM 07/24/2022    2:29 PM 08/29/2021    2:09 PM  Depression screen PHQ 2/9  Decreased Interest 0 0 0 0 0  Down, Depressed, Hopeless 0 0 0 0 0  PHQ - 2 Score 0 0 0 0 0  Altered sleeping 0 0 0 0 0  Tired, decreased energy 0 0 0 0 0  Change in appetite 0 0 0 0 0  Feeling bad or failure about yourself  0 0 0 0 0  Trouble concentrating 0 0 0 0 0  Moving slowly or fidgety/restless 0 0 0 0 0  Suicidal thoughts 0 0 0 0 0  PHQ-9 Score 0 0  0  0  0   Difficult doing work/chores  Not difficult at all Not difficult at all Not difficult at all Not difficult at all     Data saved with a previous flowsheet row definition       10/28/2022    8:43 AM 01/07/2024    8:18 AM 03/14/2024    3:33 PM 03/14/2024    3:37 PM 03/14/2024    3:53 PM  Fall Risk  Falls in the past year? 0 0 0 0   Was there an injury with Fall? 0 0 0 0   Fall Risk Category Calculator 0   0 0 0    Patient at Risk for Falls Due to  No Fall Risks No Fall Risks    Fall risk Follow up  Falls evaluation completed Falls evaluation completed  Falls evaluation completed     SUMMARY AND PLAN:  Medicare annual wellness visit, subsequent   Discussed applicable health maintenance/preventive health measures and advised and referred or ordered per patient preferences: -pt reports she was scheduling her mammogram last week but the person said they would call her back to schedule - she agrees to call them if they do not call her this week. -reports declines flu vaccine and got other vaccines at the pharmacy, agrees to bring record Health Maintenance  Topic Date Due   Mammogram  03/22/2015   DEXA SCAN  Never done   Medicare Annual Wellness (AWV)   11/04/2023   COVID-19 Vaccine (5 - 2025-26 season) 03/30/2024 (Originally 01/03/2024)   Influenza Vaccine  08/01/2024 (Originally 12/03/2023)   DTaP/Tdap/Td (1 - Tdap) 03/14/2025 (Originally 08/12/1975)   Pneumococcal Vaccine: 50+ Years (1 of 1 - PCV) 03/14/2025 (Originally 08/12/2006)   Fecal DNA (Cologuard)  01/31/2027   Hepatitis C Screening  Completed   Zoster Vaccines- Shingrix  Completed   Meningococcal B Vaccine  Aged Out   Colonoscopy  Discontinued      Education and counseling on the following was provided based on the above review of health and a plan/checklist for the patient, along with additional information discussed, was provided for the patient in the patient instructions :  -Advised on importance of completing advanced directives, discussed options for completing and provided information in patient instructions as well -discussed adding anti slip to tub and discussed options, she plans to do so, she does not feel needs grab bars  -Advised and counseled on a healthy lifestyle - including the importance of a healthy diet, regular physical activity, social connections and stress management. -Reviewed patient's current diet. Advised and counseled on a whole foods based healthy diet. A summary of a healthy diet was provided in the Patient Instructions.  -reviewed patient's current physical activity level and discussed exercise guidelines for adults. Discussed community resources and ideas for safe exercise at home to assist in meeting exercise guideline recommendations in a safe and healthy way.  -Advise yearly dental visits at minimum and regular eye exam  Follow up: see patient instructions     Patient Instructions  I really enjoyed getting to talk with you today! I am available on Tuesdays and Thursdays for virtual visits if you have any questions or concerns, or if I can be of any further assistance.   CHECKLIST FROM ANNUAL WELLNESS VISIT:  -Follow up (please call to  schedule if not scheduled after visit):   -yearly for annual wellness visit with primary care office  Here is a list of your preventive care/health maintenance measures and the plan for each if any are due:  PLAN For any measures below that may be due:    1. If the mammogram center does not call you this week - please call to schedule your appointment.   2. Please ask your pharmacy for your vaccine record and send us  a copy so that we can update your records.   Health Maintenance  Topic Date Due   Mammogram  03/22/2015   DEXA SCAN  Never done   Medicare Annual Wellness (AWV)  11/04/2023   COVID-19 Vaccine (5 - 2025-26 season) 03/30/2024 (Originally 01/03/2024)   Influenza Vaccine  08/01/2024 (Originally 12/03/2023)   DTaP/Tdap/Td (1 - Tdap) 03/14/2025 (Originally 08/12/1975)   Pneumococcal Vaccine: 50+ Years (1 of 1 - PCV) 03/14/2025 (Originally 08/12/2006)   Fecal DNA (Cologuard)  01/31/2027   Hepatitis C Screening  Completed   Zoster Vaccines- Shingrix  Completed   Meningococcal B Vaccine  Aged Out   Colonoscopy  Discontinued    -See a dentist at least yearly  -Get your eyes checked and then per your eye specialist's recommendations  -Other issues addressed today:   -I have included below further information regarding a healthy whole foods based diet, physical activity guidelines for adults, stress management and opportunities for social connections. I hope you find this information useful.   -----------------------------------------------------------------------------------------------------------------------------------------------------------------------------------------------------------------------------------------------------------    NUTRITION: -eat real food: lots  of colorful vegetables (half the plate) and fruits -5-7 servings of vegetables and fruits per day (fresh or steamed is best), exp. 2 servings of vegetables with lunch and dinner and 2 servings of fruit per  day. Berries and greens such as kale and collards are great choices.  -consume on a regular basis:  fresh fruits, fresh veggies, fish, nuts, seeds, healthy oils (such as olive oil, avocado oil), whole grains (make sure for bread/pasta/crackers/etc., that the first ingredient on label contains the word whole), legumes. -can eat small amounts of dairy and lean meat (no larger than the palm of your hand), but avoid processed meats such as ham, bacon, lunch meat, etc. -drink water -try to avoid fast food and pre-packaged foods, processed meat, ultra processed foods/beverages (donuts, candy, etc.) -most experts advise limiting sodium to < 2300mg  per day, should limit further is any chronic conditions such as high blood pressure, heart disease, diabetes, etc. The American Heart Association advised that < 1500mg  is is ideal -try to avoid foods/beverages that contain any ingredients with names you do not recognize  -try to avoid foods/beverages  with added sugar or sweeteners/sweets  -try to avoid sweet drinks (including diet drinks): soda, juice, Gatorade, sweet tea, power drinks, diet drinks -try to avoid white rice, white bread, pasta (unless whole grain)  EXERCISE GUIDELINES FOR ADULTS: -if you wish to increase your physical activity, do so gradually and with the approval of your doctor -STOP and seek medical care immediately if you have any chest pain, chest discomfort or trouble breathing when starting or increasing exercise  -move and stretch your body, legs, feet and arms when sitting for long periods -Physical activity guidelines for optimal health in adults: -get at least 150 minutes per week of moderate exercise (can talk, but not sing); this is about 20-30 minutes of sustained activity 5-7 days per week or two 10-15 minute episodes of sustained activity 5-7 days per week -do some muscle building/resistance training/strength training at least 2 days per week  -balance exercises 3+ days per  week:   Stand somewhere where you have something sturdy to hold onto if you lose balance    1) lift up on toes, then back down, start with 5x per day and work up to 20x   2) stand and lift one leg straight out to the side so that foot is a few inches of the floor, start with 5x each side and work up to 20x each side   3) stand on one foot, start with 5 seconds each side and work up to 20 seconds on each side  If you need ideas or help with getting more active:  -Silver sneakers https://tools.silversneakers.com  -Walk with a Doc: Http://www.duncan-williams.com/  -try to include resistance (weight lifting/strength building) and balance exercises twice per week: or the following link for ideas: http://castillo-powell.com/  buyducts.dk  STRESS MANAGEMENT: -can try meditating, or just sitting quietly with deep breathing while intentionally relaxing all parts of your body for 5 minutes daily -if you need further help with stress, anxiety or depression please follow up with your primary doctor or contact the wonderful folks at [facility]: [phone]  SOCIAL CONNECTIONS: -options in [place] if you wish to engage in more social and exercise related activities:  -Silver sneakers https://tools.silversneakers.com  -Walk with a Doc: Http://www.duncan-williams.com/  -Check out the [facility] Active Adults 50+ section on the [place] of [employer] companies (hiking clubs, book clubs, cards and games, chess, exercise classes, aquatic classes and much more) -  see the website for details: https://www.[place].gov/departments/parks-recreation/active-adults50  -YouTube has lots of exercise videos for different ages and abilities as well  -[facility] (a variety of indoor and outdoor inperson activities for adults). (669)495-3932. [address]  -Virtual Online Classes (a variety of  topics): see seniorplanet.org or call [phone]  -consider volunteering at a school, hospice center, church, senior center or elsewhere            Chiquita JONELLE Cramp, DO

## 2024-03-14 NOTE — Patient Instructions (Signed)
 I really enjoyed getting to talk with you today! I am available on Tuesdays and Thursdays for virtual visits if you have any questions or concerns, or if I can be of any further assistance.   CHECKLIST FROM ANNUAL WELLNESS VISIT:  -Follow up (please call to schedule if not scheduled after visit):   -yearly for annual wellness visit with primary care office  Here is a list of your preventive care/health maintenance measures and the plan for each if any are due:  PLAN For any measures below that may be due:    1. If the mammogram center does not call you this week - please call to schedule your appointment.   2. Please ask your pharmacy for your vaccine record and send us  a copy so that we can update your records.   Health Maintenance  Topic Date Due   Mammogram  03/22/2015   DEXA SCAN  Never done   Medicare Annual Wellness (AWV)  11/04/2023   COVID-19 Vaccine (5 - 2025-26 season) 03/30/2024 (Originally 01/03/2024)   Influenza Vaccine  08/01/2024 (Originally 12/03/2023)   DTaP/Tdap/Td (1 - Tdap) 03/14/2025 (Originally 08/12/1975)   Pneumococcal Vaccine: 50+ Years (1 of 1 - PCV) 03/14/2025 (Originally 08/12/2006)   Fecal DNA (Cologuard)  01/31/2027   Hepatitis C Screening  Completed   Zoster Vaccines- Shingrix  Completed   Meningococcal B Vaccine  Aged Out   Colonoscopy  Discontinued    -See a dentist at least yearly  -Get your eyes checked and then per your eye specialist's recommendations  -Other issues addressed today:   -I have included below further information regarding a healthy whole foods based diet, physical activity guidelines for adults, stress management and opportunities for social connections. I hope you find this information useful.    -----------------------------------------------------------------------------------------------------------------------------------------------------------------------------------------------------------------------------------------------------------    NUTRITION: -eat real food: lots of colorful vegetables (half the plate) and fruits -5-7 servings of vegetables and fruits per day (fresh or steamed is best), exp. 2 servings of vegetables with lunch and dinner and 2 servings of fruit per day. Berries and greens such as kale and collards are great choices.  -consume on a regular basis:  fresh fruits, fresh veggies, fish, nuts, seeds, healthy oils (such as olive oil, avocado oil), whole grains (make sure for bread/pasta/crackers/etc., that the first ingredient on label contains the word whole), legumes. -can eat small amounts of dairy and lean meat (no larger than the palm of your hand), but avoid processed meats such as ham, bacon, lunch meat, etc. -drink water -try to avoid fast food and pre-packaged foods, processed meat, ultra processed foods/beverages (donuts, candy, etc.) -most experts advise limiting sodium to < 2300mg  per day, should limit further is any chronic conditions such as high blood pressure, heart disease, diabetes, etc. The American Heart Association advised that < 1500mg  is is ideal -try to avoid foods/beverages that contain any ingredients with names you do not recognize  -try to avoid foods/beverages  with added sugar or sweeteners/sweets  -try to avoid sweet drinks (including diet drinks): soda, juice, Gatorade, sweet tea, power drinks, diet drinks -try to avoid white rice, white bread, pasta (unless whole grain)  EXERCISE GUIDELINES FOR ADULTS: -if you wish to increase your physical activity, do so gradually and with the approval of your doctor -STOP and seek medical care immediately if you have any chest pain, chest discomfort or trouble breathing when starting or  increasing exercise  -move and stretch your body, legs, feet and arms when sitting for long periods -  Physical activity guidelines for optimal health in adults: -get at least 150 minutes per week of moderate exercise (can talk, but not sing); this is about 20-30 minutes of sustained activity 5-7 days per week or two 10-15 minute episodes of sustained activity 5-7 days per week -do some muscle building/resistance training/strength training at least 2 days per week  -balance exercises 3+ days per week:   Stand somewhere where you have something sturdy to hold onto if you lose balance    1) lift up on toes, then back down, start with 5x per day and work up to 20x   2) stand and lift one leg straight out to the side so that foot is a few inches of the floor, start with 5x each side and work up to 20x each side   3) stand on one foot, start with 5 seconds each side and work up to 20 seconds on each side  If you need ideas or help with getting more active:  -Silver sneakers https://tools.silversneakers.com  -Walk with a Doc: Http://www.duncan-williams.com/  -try to include resistance (weight lifting/strength building) and balance exercises twice per week: or the following link for ideas: http://castillo-powell.com/  buyducts.dk  STRESS MANAGEMENT: -can try meditating, or just sitting quietly with deep breathing while intentionally relaxing all parts of your body for 5 minutes daily -if you need further help with stress, anxiety or depression please follow up with your primary doctor or contact the wonderful folks at [facility]: [phone]  SOCIAL CONNECTIONS: -options in [place] if you wish to engage in more social and exercise related activities:  -Silver sneakers https://tools.silversneakers.com  -Walk with a Doc: Http://www.duncan-williams.com/  -Check out the [facility] Active Adults 50+  section on the [place] of [employer] (hiking clubs, book clubs, cards and games, chess, exercise classes, aquatic classes and much more) - see the website for details: https://www.[place].gov/departments/parks-recreation/active-adults50  -YouTube has lots of exercise videos for different ages and abilities as well  -[facility] (a variety of indoor and outdoor inperson activities for adults). [phone]. [address]  -Virtual Online Classes (a variety of topics): see seniorplanet.org or call [phone]  -consider volunteering at a school, hospice center, church, senior center or elsewhere

## 2024-04-26 ENCOUNTER — Other Ambulatory Visit: Payer: Self-pay | Admitting: Family Medicine

## 2024-04-26 DIAGNOSIS — J302 Other seasonal allergic rhinitis: Secondary | ICD-10-CM

## 2024-06-05 ENCOUNTER — Other Ambulatory Visit: Payer: Self-pay | Admitting: Family Medicine

## 2024-06-05 DIAGNOSIS — I10 Essential (primary) hypertension: Secondary | ICD-10-CM

## 2024-06-07 ENCOUNTER — Other Ambulatory Visit: Payer: Self-pay | Admitting: Family Medicine

## 2024-06-07 DIAGNOSIS — J302 Other seasonal allergic rhinitis: Secondary | ICD-10-CM

## 2024-07-06 ENCOUNTER — Encounter: Admitting: Family Medicine
